# Patient Record
Sex: Male | Born: 1985 | Race: White | Hispanic: Yes | Marital: Single | Smoking: Never smoker
Health system: Southern US, Community
[De-identification: ages and names within clinical notes are randomized; demographics above are authoritative.]

## PROBLEM LIST (undated history)

## (undated) HISTORY — PX: HEMORRHOID SURGERY: SHX153

---
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
## 2004-03-07 ENCOUNTER — Emergency Department (HOSPITAL_COMMUNITY): Admission: EM | Admit: 2004-03-07 | Discharge: 2004-03-07 | Payer: Self-pay | Admitting: Emergency Medicine

## 2004-03-08 ENCOUNTER — Emergency Department (HOSPITAL_COMMUNITY): Admission: EM | Admit: 2004-03-08 | Discharge: 2004-03-08 | Payer: Self-pay | Admitting: Emergency Medicine

## 2004-10-25 ENCOUNTER — Emergency Department (HOSPITAL_COMMUNITY): Admission: EM | Admit: 2004-10-25 | Discharge: 2004-10-25 | Payer: Self-pay | Admitting: Emergency Medicine

## 2007-11-27 ENCOUNTER — Emergency Department (HOSPITAL_COMMUNITY): Admission: EM | Admit: 2007-11-27 | Discharge: 2007-11-27 | Payer: Self-pay | Admitting: Emergency Medicine

## 2011-03-23 LAB — URINALYSIS, ROUTINE W REFLEX MICROSCOPIC
Bilirubin Urine: NEGATIVE
Glucose, UA: NEGATIVE
Hgb urine dipstick: NEGATIVE
Ketones, ur: NEGATIVE
Leukocytes, UA: NEGATIVE
Nitrite: NEGATIVE
Protein, ur: 30 — AB
Specific Gravity, Urine: 1.018
Urobilinogen, UA: 1
pH: 5.5

## 2011-03-23 LAB — DIFFERENTIAL
Eosinophils Absolute: 0
Eosinophils Relative: 0
Lymphocytes Relative: 6 — ABNORMAL LOW
Lymphs Abs: 0.7
Monocytes Absolute: 1.1 — ABNORMAL HIGH
Monocytes Relative: 10

## 2011-03-23 LAB — CBC
HCT: 45.6
Hemoglobin: 15.9
MCV: 86.4
RBC: 5.28
WBC: 12 — ABNORMAL HIGH

## 2011-03-23 LAB — POCT I-STAT, CHEM 8
BUN: 8
Calcium, Ion: 1.1 — ABNORMAL LOW
Creatinine, Ser: 1
Glucose, Bld: 124 — ABNORMAL HIGH
Hemoglobin: 16
Sodium: 136
TCO2: 21

## 2011-03-23 LAB — URINE MICROSCOPIC-ADD ON

## 2013-07-14 ENCOUNTER — Encounter (HOSPITAL_COMMUNITY): Payer: Self-pay | Admitting: Emergency Medicine

## 2013-07-14 ENCOUNTER — Emergency Department (HOSPITAL_COMMUNITY)
Admission: EM | Admit: 2013-07-14 | Discharge: 2013-07-14 | Disposition: A | Discharge status: Home / Self Care | Payer: Medicaid - Out of State | Attending: Emergency Medicine | Admitting: Emergency Medicine

## 2013-07-14 DIAGNOSIS — L03317 Cellulitis of buttock: Secondary | ICD-10-CM

## 2013-07-14 DIAGNOSIS — R000 Tachycardia, unspecified: Secondary | ICD-10-CM | POA: Insufficient documentation

## 2013-07-14 DIAGNOSIS — L0231 Cutaneous abscess of buttock: Secondary | ICD-10-CM | POA: Insufficient documentation

## 2013-07-14 DIAGNOSIS — K921 Melena: Secondary | ICD-10-CM | POA: Insufficient documentation

## 2013-07-14 DIAGNOSIS — Z87828 Personal history of other (healed) physical injury and trauma: Secondary | ICD-10-CM | POA: Insufficient documentation

## 2013-07-14 DIAGNOSIS — K6289 Other specified diseases of anus and rectum: Secondary | ICD-10-CM | POA: Insufficient documentation

## 2013-07-14 DIAGNOSIS — R509 Fever, unspecified: Secondary | ICD-10-CM | POA: Insufficient documentation

## 2013-07-14 MED ORDER — AMOXICILLIN-POT CLAVULANATE 875-125 MG PO TABS: 1.0000 | ORAL_TABLET | Freq: Two times a day (BID) | ORAL | Status: DC

## 2013-07-14 MED ORDER — HYDROCODONE-ACETAMINOPHEN 5-325 MG PO TABS: 1.0000 | ORAL_TABLET | Freq: Four times a day (QID) | ORAL | Status: DC | PRN

## 2013-07-14 MED ORDER — HYDROCODONE-ACETAMINOPHEN 5-325 MG PO TABS
1.0000 | ORAL_TABLET | Freq: Once | ORAL | Status: AC
  Administered 2013-07-14: 1 via ORAL
  Filled 2013-07-14: qty 1

## 2013-07-14 NOTE — ED Notes (Signed)
Patient is alert and oriented x3.  He is complaining of hemmroid pain  That started on Thursday.  He states that he only has bleeding when he Has a BM.  He adds that his BMs are very hard.  Currently he rates his pain  8 of 10.

## 2013-07-14 NOTE — Discharge Instructions (Signed)
Cellulitis Cellulitis is an infection of the skin and the tissue beneath it. The infected area is usually red and tender. Cellulitis occurs most often in the arms and lower legs.  CAUSES  Cellulitis is caused by bacteria that enter the skin through cracks or cuts in the skin. The most common types of bacteria that cause cellulitis are Staphylococcus and Streptococcus. SYMPTOMS   Redness and warmth.  Swelling.  Tenderness or pain.  Fever. DIAGNOSIS  Your caregiver can usually determine what is wrong based on a physical exam. Blood tests may also be done. TREATMENT  Treatment usually involves taking an antibiotic medicine. HOME CARE INSTRUCTIONS   Take your antibiotics as directed. Finish them even if you start to feel better.  Keep the infected arm or leg elevated to reduce swelling.  Apply a warm cloth to the affected area up to 4 times per day to relieve pain.  Only take over-the-counter or prescription medicines for pain, discomfort, or fever as directed by your caregiver.  Keep all follow-up appointments as directed by your caregiver. SEEK MEDICAL CARE IF:   You notice red streaks coming from the infected area.  Your red area gets larger or turns dark in color.  Your bone or joint underneath the infected area becomes painful after the skin has healed.  Your infection returns in the same area or another area.  You notice a swollen bump in the infected area.  You develop new symptoms. SEEK IMMEDIATE MEDICAL CARE IF:   You have a fever.  You feel very sleepy.  You develop vomiting or diarrhea.  You have a general ill feeling (malaise) with muscle aches and pains. MAKE SURE YOU:   Understand these instructions.  Will watch your condition.  Will get help right away if you are not doing well or get worse. Document Released: 03/22/2005 Document Revised: 12/12/2011 Document Reviewed: 08/28/2011 Exit[employer] Patient Information 2014 [employer].  

## 2013-07-14 NOTE — Progress Notes (Signed)
   CARE MANAGEMENT ED NOTE 07/14/2013  Patient:  [name] [name],[name] [name]   Account Number:  [id number]  Date Initiated:  07/14/2013  Documentation initiated by:  Radford PaxFERRERO,Gerald Honea  Subjective/Objective Assessment:   Patient presents to Ed with hemmoroid pain     Subjective/Objective Assessment Detail:     Action/Plan:   Action/Plan Detail:   Anticipated DC Date:       Status Recommendation to Physician:   Result of Recommendation:    Other ED Services  Consult Working Plan    DC Planning Services  Other  PCP issues    Choice offered to / List presented to:            Status of service:  Completed, signed off  ED Comments:   ED Comments Detail:  EDCM spoke to patient at bedside.  Patient confirms he does not have a pcp and his Medicaid is not of [place].  EDCM provided patient with  a list of pcps who accept Medicaid insurance and also provided patient with the phone number to the DSS to have Medicaid insurance switched to [place]. Patient thankful for resources.  No furhter CM needs at this time.

## 2013-07-14 NOTE — ED Notes (Signed)
Pt states he has a hemorrhoid that developed on Thursday and got bigger on Friday  Pt states he has been using tumeric paste  Pt states he has bleeding with bowel movements  Last one was yesterday

## 2013-07-14 NOTE — ED Notes (Signed)
Patient is alert and oriented x3.  He was given DC instructions and follow up visit instructions.  Patient gave verbal understanding.  He was DC ambulatory under his own power to home.  V/S stable.  He was not showing any signs of distress on DC 
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
## 2013-07-17 ENCOUNTER — Inpatient Hospital Stay (HOSPITAL_COMMUNITY)
Admission: EM | Admit: 2013-07-17 | Discharge: 2013-07-22 | DRG: 349 | Disposition: A | Discharge status: Home / Self Care | Payer: Self-pay | Attending: General Surgery | Admitting: General Surgery

## 2013-07-17 ENCOUNTER — Encounter (HOSPITAL_COMMUNITY): Payer: Self-pay | Admitting: Emergency Medicine

## 2013-07-17 DIAGNOSIS — L03317 Cellulitis of buttock: Secondary | ICD-10-CM

## 2013-07-17 DIAGNOSIS — K612 Anorectal abscess: Principal | ICD-10-CM | POA: Diagnosis present

## 2013-07-17 DIAGNOSIS — K610 Anal abscess: Secondary | ICD-10-CM

## 2013-07-17 LAB — CBC WITH DIFFERENTIAL/PLATELET
BASOS ABS: 0 10*3/uL (ref 0.0–0.1)
Basophils Relative: 0 % (ref 0–1)
Eosinophils Absolute: 0.1 10*3/uL (ref 0.0–0.7)
Eosinophils Relative: 1 % (ref 0–5)
HEMATOCRIT: 41.3 % (ref 39.0–52.0)
HEMOGLOBIN: 15.1 g/dL (ref 13.0–17.0)
LYMPHS PCT: 18 % (ref 12–46)
Lymphs Abs: 2.1 10*3/uL (ref 0.7–4.0)
MCH: 30.3 pg (ref 26.0–34.0)
MCHC: 36.6 g/dL — ABNORMAL HIGH (ref 30.0–36.0)
MCV: 82.8 fL (ref 78.0–100.0)
MONO ABS: 1.1 10*3/uL — AB (ref 0.1–1.0)
MONOS PCT: 10 % (ref 3–12)
NEUTROS ABS: 8.3 10*3/uL — AB (ref 1.7–7.7)
NEUTROS PCT: 72 % (ref 43–77)
Platelets: 346 10*3/uL (ref 150–400)
RBC: 4.99 MIL/uL (ref 4.22–5.81)
RDW: 11.5 % (ref 11.5–15.5)
WBC: 11.6 10*3/uL — AB (ref 4.0–10.5)

## 2013-07-17 LAB — BASIC METABOLIC PANEL
BUN: 8 mg/dL (ref 6–23)
CHLORIDE: 96 meq/L (ref 96–112)
CO2: 23 meq/L (ref 19–32)
CREATININE: 0.83 mg/dL (ref 0.50–1.35)
Calcium: 9 mg/dL (ref 8.4–10.5)
GFR calc Af Amer: >90 mL/min (ref >90)
GFR calc non Af Amer: >90 mL/min (ref >90)
Glucose, Bld: 101 mg/dL — ABNORMAL HIGH (ref 70–99)
POTASSIUM: 4.2 meq/L (ref 3.7–5.3)
Sodium: 134 mEq/L — ABNORMAL LOW (ref 137–147)

## 2013-07-17 MED ORDER — SODIUM CHLORIDE 0.9 % IV BOLUS (SEPSIS)
1000.0000 mL | Freq: Once | INTRAVENOUS | Status: AC
  Administered 2013-07-17: 1000 mL via INTRAVENOUS

## 2013-07-17 MED ORDER — ONDANSETRON HCL 4 MG/2ML IJ SOLN
4.0000 mg | Freq: Once | INTRAMUSCULAR | Status: AC
  Administered 2013-07-17: 4 mg via INTRAVENOUS
  Filled 2013-07-17: qty 2

## 2013-07-17 MED ORDER — OXYCODONE-ACETAMINOPHEN 5-325 MG PO TABS
1.0000 | ORAL_TABLET | Freq: Once | ORAL | Status: AC
  Administered 2013-07-17: 1 via ORAL
  Filled 2013-07-17: qty 1

## 2013-07-17 MED ORDER — MORPHINE SULFATE 4 MG/ML IJ SOLN
4.0000 mg | Freq: Once | INTRAMUSCULAR | Status: AC
  Administered 2013-07-17: 4 mg via INTRAVENOUS
  Filled 2013-07-17: qty 1

## 2013-07-17 MED ORDER — CLINDAMYCIN PHOSPHATE 600 MG/50ML IV SOLN
600.0000 mg | Freq: Once | INTRAVENOUS | Status: AC
  Administered 2013-07-17: 600 mg via INTRAVENOUS
  Filled 2013-07-17: qty 50

## 2013-07-17 NOTE — ED Notes (Signed)
Pt states he was seen at [facility] on Tuesday and DX with cellulitis on his right buttox.  Pt states he has been taking the medication he was prescribed without relief from pain, and the area is getting larger.
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
## 2013-07-18 ENCOUNTER — Emergency Department (HOSPITAL_COMMUNITY): Payer: Self-pay

## 2013-07-18 ENCOUNTER — Encounter (HOSPITAL_COMMUNITY): Payer: Self-pay | Admitting: Radiology

## 2013-07-18 ENCOUNTER — Encounter (HOSPITAL_COMMUNITY): Admission: EM | Disposition: A | Discharge status: Home / Self Care | Payer: Self-pay | Source: Home / Self Care

## 2013-07-18 ENCOUNTER — Observation Stay (HOSPITAL_COMMUNITY): Payer: Self-pay | Admitting: Anesthesiology

## 2013-07-18 ENCOUNTER — Encounter (HOSPITAL_COMMUNITY): Payer: Self-pay | Admitting: Anesthesiology

## 2013-07-18 DIAGNOSIS — K610 Anal abscess: Secondary | ICD-10-CM | POA: Diagnosis present

## 2013-07-18 DIAGNOSIS — K612 Anorectal abscess: Secondary | ICD-10-CM

## 2013-07-18 HISTORY — PX: INCISION AND DRAINAGE PERIRECTAL ABSCESS: SHX1804

## 2013-07-18 LAB — MRSA PCR SCREENING: MRSA BY PCR: NEGATIVE

## 2013-07-18 SURGERY — INCISION AND DRAINAGE, ABSCESS, PERIRECTAL
Anesthesia: General | Site: Rectum | Laterality: Right

## 2013-07-18 MED ORDER — MORPHINE SULFATE 4 MG/ML IJ SOLN
4.0000 mg | INTRAMUSCULAR | Status: DC | PRN
  Administered 2013-07-18: 4 mg via INTRAVENOUS
  Filled 2013-07-18: qty 1

## 2013-07-18 MED ORDER — IOHEXOL 300 MG/ML  SOLN
80.0000 mL | Freq: Once | INTRAMUSCULAR | Status: AC | PRN
  Administered 2013-07-18: 80 mL via INTRAVENOUS

## 2013-07-18 MED ORDER — LACTATED RINGERS IV SOLN
INTRAVENOUS | Status: DC
  Administered 2013-07-18: 12:00:00 via INTRAVENOUS
  Administered 2013-07-18: 20 mL/h via INTRAVENOUS

## 2013-07-18 MED ORDER — ROCURONIUM BROMIDE 50 MG/5ML IV SOLN
INTRAVENOUS | Status: AC
  Filled 2013-07-18: qty 1

## 2013-07-18 MED ORDER — LIDOCAINE HCL (CARDIAC) 10 MG/ML IV SOLN
INTRAVENOUS | Status: DC | PRN
  Administered 2013-07-18: 100 mg via INTRAVENOUS

## 2013-07-18 MED ORDER — FENTANYL CITRATE 0.05 MG/ML IJ SOLN
INTRAMUSCULAR | Status: AC
  Filled 2013-07-18: qty 5

## 2013-07-18 MED ORDER — MIDAZOLAM HCL 5 MG/5ML IJ SOLN
INTRAMUSCULAR | Status: DC | PRN
  Administered 2013-07-18: 2 mg via INTRAVENOUS

## 2013-07-18 MED ORDER — SODIUM CHLORIDE 0.9 % IV SOLN
INTRAVENOUS | Status: AC
  Administered 2013-07-18: 03:00:00 via INTRAVENOUS

## 2013-07-18 MED ORDER — BUPIVACAINE-EPINEPHRINE PF 0.5-1:200000 % IJ SOLN
INTRAMUSCULAR | Status: DC | PRN
  Administered 2013-07-18: 30 mL

## 2013-07-18 MED ORDER — ONDANSETRON HCL 4 MG/2ML IJ SOLN
INTRAMUSCULAR | Status: DC | PRN
  Administered 2013-07-18: 4 mg via INTRAVENOUS

## 2013-07-18 MED ORDER — LIDOCAINE HCL (CARDIAC) 20 MG/ML IV SOLN
INTRAVENOUS | Status: AC
  Filled 2013-07-18: qty 5

## 2013-07-18 MED ORDER — OXYCODONE-ACETAMINOPHEN 5-325 MG PO TABS
1.0000 | ORAL_TABLET | ORAL | Status: DC | PRN
  Administered 2013-07-18 – 2013-07-22 (×12): 2 via ORAL
  Filled 2013-07-18 (×12): qty 2

## 2013-07-18 MED ORDER — MIDAZOLAM HCL 2 MG/2ML IJ SOLN
INTRAMUSCULAR | Status: AC
  Filled 2013-07-18: qty 2

## 2013-07-18 MED ORDER — CLINDAMYCIN PHOSPHATE 600 MG/50ML IV SOLN
600.0000 mg | Freq: Four times a day (QID) | INTRAVENOUS | Status: DC
  Administered 2013-07-18 – 2013-07-22 (×18): 600 mg via INTRAVENOUS
  Filled 2013-07-18 (×21): qty 50

## 2013-07-18 MED ORDER — OXYCODONE HCL 5 MG/5ML PO SOLN: 5.0000 mg | Freq: Once | ORAL | Status: DC | PRN

## 2013-07-18 MED ORDER — ONDANSETRON HCL 4 MG/2ML IJ SOLN: 4.0000 mg | Freq: Once | INTRAMUSCULAR | Status: DC | PRN

## 2013-07-18 MED ORDER — ONDANSETRON HCL 4 MG/2ML IJ SOLN
INTRAMUSCULAR | Status: AC
  Filled 2013-07-18: qty 2

## 2013-07-18 MED ORDER — LACTATED RINGERS IV SOLN
INTRAVENOUS | Status: DC | PRN
  Administered 2013-07-18: 12:00:00 via INTRAVENOUS

## 2013-07-18 MED ORDER — HYDROMORPHONE HCL PF 1 MG/ML IJ SOLN: 0.2500 mg | INTRAMUSCULAR | Status: DC | PRN

## 2013-07-18 MED ORDER — FENTANYL CITRATE 0.05 MG/ML IJ SOLN
INTRAMUSCULAR | Status: DC | PRN
  Administered 2013-07-18 (×3): 50 ug via INTRAVENOUS

## 2013-07-18 MED ORDER — ONDANSETRON HCL 4 MG/2ML IJ SOLN: 4.0000 mg | Freq: Three times a day (TID) | INTRAMUSCULAR | Status: AC | PRN

## 2013-07-18 MED ORDER — PROPOFOL 10 MG/ML IV BOLUS
INTRAVENOUS | Status: DC | PRN
  Administered 2013-07-18: 200 mg via INTRAVENOUS

## 2013-07-18 MED ORDER — 0.9 % SODIUM CHLORIDE (POUR BTL) OPTIME
TOPICAL | Status: DC | PRN
  Administered 2013-07-18: 1000 mL

## 2013-07-18 MED ORDER — OXYCODONE HCL 5 MG PO TABS: 5.0000 mg | ORAL_TABLET | Freq: Once | ORAL | Status: DC | PRN

## 2013-07-18 MED ORDER — MEPERIDINE HCL 25 MG/ML IJ SOLN: 6.2500 mg | INTRAMUSCULAR | Status: DC | PRN

## 2013-07-18 MED ORDER — PROPOFOL 10 MG/ML IV BOLUS
INTRAVENOUS | Status: AC
  Filled 2013-07-18: qty 20

## 2013-07-18 MED ORDER — PIPERACILLIN-TAZOBACTAM 3.375 G IVPB
3.3750 g | Freq: Three times a day (TID) | INTRAVENOUS | Status: DC
  Administered 2013-07-18 – 2013-07-22 (×13): 3.375 g via INTRAVENOUS
  Filled 2013-07-18 (×16): qty 50

## 2013-07-18 MED ORDER — MORPHINE SULFATE 4 MG/ML IJ SOLN: 4.0000 mg | INTRAMUSCULAR | Status: DC | PRN

## 2013-07-18 MED ORDER — HYDROMORPHONE HCL PF 1 MG/ML IJ SOLN
1.0000 mg | INTRAMUSCULAR | Status: DC | PRN
  Administered 2013-07-18 (×5): 1 mg via INTRAVENOUS
  Administered 2013-07-19: 2 mg via INTRAVENOUS
  Administered 2013-07-19: 1 mg via INTRAVENOUS
  Administered 2013-07-19: 2 mg via INTRAVENOUS
  Administered 2013-07-20: 1 mg via INTRAVENOUS
  Administered 2013-07-20: 2 mg via INTRAVENOUS
  Administered 2013-07-21 – 2013-07-22 (×4): 1 mg via INTRAVENOUS
  Filled 2013-07-18: qty 2
  Filled 2013-07-18 (×3): qty 1
  Filled 2013-07-18: qty 2
  Filled 2013-07-18 (×2): qty 1
  Filled 2013-07-18 (×2): qty 2
  Filled 2013-07-18 (×5): qty 1

## 2013-07-18 SURGICAL SUPPLY — 40 items
BLADE SURG 15 STRL LF DISP TIS (BLADE) ×1 IMPLANT
BLADE SURG 15 STRL SS (BLADE) ×3
CANISTER SUCTION 2500CC (MISCELLANEOUS) ×3 IMPLANT
COVER SURGICAL LIGHT HANDLE (MISCELLANEOUS) ×3 IMPLANT
DRSG KUZMA FLUFF (GAUZE/BANDAGES/DRESSINGS) ×2 IMPLANT
DRSG PAD ABDOMINAL 8X10 ST (GAUZE/BANDAGES/DRESSINGS) ×3 IMPLANT
ELECT CAUTERY BLADE 6.4 (BLADE) ×3 IMPLANT
ELECT REM PT RETURN 9FT ADLT (ELECTROSURGICAL) ×3
ELECTRODE REM PT RTRN 9FT ADLT (ELECTROSURGICAL) ×1 IMPLANT
GAUZE PACKING IODOFORM 1 (PACKING) IMPLANT
GLOVE BIO SURGEON STRL SZ7.5 (GLOVE) ×2 IMPLANT
GLOVE BIO SURGEON STRL SZ8 (GLOVE) ×4 IMPLANT
GLOVE BIOGEL PI IND STRL 7.5 (GLOVE) IMPLANT
GLOVE BIOGEL PI IND STRL 8 (GLOVE) IMPLANT
GLOVE BIOGEL PI INDICATOR 7.5 (GLOVE) ×2
GLOVE BIOGEL PI INDICATOR 8 (GLOVE) ×2
GLOVE SURG SIGNA 7.5 PF LTX (GLOVE) ×3 IMPLANT
GOWN STRL NON-REIN LRG LVL3 (GOWN DISPOSABLE) ×3 IMPLANT
GOWN STRL REIN XL XLG (GOWN DISPOSABLE) ×5 IMPLANT
KIT BASIN OR (CUSTOM PROCEDURE TRAY) ×3 IMPLANT
KIT ROOM TURNOVER OR (KITS) ×3 IMPLANT
NEEDLE 22X1 1/2 (OR ONLY) (NEEDLE) ×2 IMPLANT
NS IRRIG 1000ML POUR BTL (IV SOLUTION) ×3 IMPLANT
PACK LITHOTOMY IV (CUSTOM PROCEDURE TRAY) ×3 IMPLANT
PAD ARMBOARD 7.5X6 YLW CONV (MISCELLANEOUS) ×3 IMPLANT
PENCIL BUTTON HOLSTER BLD 10FT (ELECTRODE) ×3 IMPLANT
SPONGE GAUZE 4X4 12PLY (GAUZE/BANDAGES/DRESSINGS) ×3 IMPLANT
SPONGE LAP 18X18 X RAY DECT (DISPOSABLE) ×3 IMPLANT
SWAB COLLECTION DEVICE MRSA (MISCELLANEOUS) IMPLANT
SYR BULB 3OZ (MISCELLANEOUS) ×3 IMPLANT
SYR CONTROL 10ML LL (SYRINGE) ×2 IMPLANT
TAPE CLOTH SURG 6X10 WHT LF (GAUZE/BANDAGES/DRESSINGS) ×2 IMPLANT
TOWEL OR 17X24 6PK STRL BLUE (TOWEL DISPOSABLE) ×3 IMPLANT
TOWEL OR 17X26 10 PK STRL BLUE (TOWEL DISPOSABLE) ×3 IMPLANT
TOWEL OR NON WOVEN STRL DISP B (DISPOSABLE) ×2 IMPLANT
TUBE ANAEROBIC SPECIMEN COL (MISCELLANEOUS) IMPLANT
TUBE CONNECTING 12'X1/4 (SUCTIONS) ×1
TUBE CONNECTING 12X1/4 (SUCTIONS) ×2 IMPLANT
UNDERPAD 30X30 INCONTINENT (UNDERPADS AND DIAPERS) ×3 IMPLANT
YANKAUER SUCT BULB TIP NO VENT (SUCTIONS) ×3 IMPLANT

## 2013-07-18 NOTE — Transfer of Care (Signed)
Immediate Anesthesia Transfer of Care Note  Patient: [name] [name]  Procedure(s) Performed: Procedure(s): IRRIGATION AND DEBRIDEMENT PERIRECTAL ABSCESS (Right)  Patient Location: PACU  Anesthesia Type:General  Level of Consciousness: sedated  Airway & Oxygen Therapy: Patient Spontanous Breathing and Patient connected to nasal cannula oxygen  Post-op Assessment: Report given to PACU RN and Post -op Vital signs reviewed and stable  Post vital signs: Reviewed  Complications: No apparent anesthesia complications

## 2013-07-18 NOTE — Interval H&P Note (Signed)
History and Physical Interval Note: no change in H adn P.  Plan I and D of perirectal abscess on right.  I again discussed the risks with him  07/18/2013 10:44 AM  Wiliam Kelemente Codd  has presented today for surgery, with the diagnosis of Perirectal abscess  The various methods of treatment have been discussed with the patient and family. After consideration of risks, benefits and other options for treatment, the patient has consented to  Procedure(s): IRRIGATION AND DEBRIDEMENT PERIRECTAL ABSCESS (Right) as a surgical intervention .  The patient's history has been reviewed, patient examined, no change in status, stable for surgery.  I have reviewed the patient's chart and labs.  Questions were answered to the patient's satisfaction.     Milka Windholz A

## 2013-07-18 NOTE — Anesthesia Preprocedure Evaluation (Signed)
Anesthesia Evaluation  Patient identified by MRN, date of birth, ID band Patient awake    Reviewed: Allergy & Precautions, H&P , NPO status , Patient's Chart, lab work & pertinent test results  Airway       Dental   Pulmonary          Cardiovascular     Neuro/Psych    GI/Hepatic   Endo/Other    Renal/GU      Musculoskeletal   Abdominal   Peds  Hematology   Anesthesia Other Findings   Reproductive/Obstetrics                           Anesthesia Physical Anesthesia Plan  ASA: II  Anesthesia Plan: General   Post-op Pain Management:    Induction:   Airway Management Planned:   Additional Equipment:   Intra-op Plan:   Post-operative Plan:   Informed Consent: I have reviewed the patients History and Physical, chart, labs and discussed the procedure including the risks, benefits and alternatives for the proposed anesthesia with the patient or authorized representative who has indicated his/her understanding and acceptance.     Plan Discussed with: CRNA and Surgeon  Anesthesia Plan Comments:         Anesthesia Quick Evaluation

## 2013-07-18 NOTE — ED Provider Notes (Signed)
Care assumed from Dr Bernette MayersSheldon at change of shift.  Pt with redness, swelling ongoing for the last 4-5 days.  [facility], started on augmentin.  Pt with worsening redness, concern for abscess.  CT pending.  Pt reports pain is severe now, unable to walk or lay on back.  No fevers.  Has h/o buttock abscess in past requiring drainage, but not this severe.  No other significant pmh.  Results for orders placed during the hospital encounter of 07/17/13  CBC WITH DIFFERENTIAL      Result Value Range   WBC 11.6 (*) 4.0 - 10.5 K/uL   RBC 4.99  4.22 - 5.81 MIL/uL   Hemoglobin 15.1  13.0 - 17.0 g/dL   HCT 16.141.3  09.639.0 - 04.552.0 %   MCV 82.8  78.0 - 100.0 fL   MCH 30.3  26.0 - 34.0 pg   MCHC 36.6 (*) 30.0 - 36.0 g/dL   RDW 40.911.5  81.111.5 - 91.415.5 %   Platelets 346  150 - 400 K/uL   Neutrophils Relative % 72  43 - 77 %   Neutro Abs 8.3 (*) 1.7 - 7.7 K/uL   Lymphocytes Relative 18  12 - 46 %   Lymphs Abs 2.1  0.7 - 4.0 K/uL   Monocytes Relative 10  3 - 12 %   Monocytes Absolute 1.1 (*) 0.1 - 1.0 K/uL   Eosinophils Relative 1  0 - 5 %   Eosinophils Absolute 0.1  0.0 - 0.7 K/uL   Basophils Relative 0  0 - 1 %   Basophils Absolute 0.0  0.0 - 0.1 K/uL  BASIC METABOLIC PANEL      Result Value Range   Sodium 134 (*) 137 - 147 mEq/L   Potassium 4.2  3.7 - 5.3 mEq/L   Chloride 96  96 - 112 mEq/L   CO2 23  19 - 32 mEq/L   Glucose, Bld 101 (*) 70 - 99 mg/dL   BUN 8  6 - 23 mg/dL   Creatinine, Ser 7.820.83  0.50 - 1.35 mg/dL   Calcium 9.0  8.4 - 95.610.5 mg/dL   GFR calc non Af Amer >90  >90 mL/min   GFR calc Af Amer >90  >90 mL/min   Ct Pelvis W Contrast  07/18/2013   CLINICAL DATA:  Diagnosed with cellulitis of right buttock. Evaluate perirectal abscess. Worsening pain.  EXAM: CT PELVIS WITH CONTRAST  TECHNIQUE: Multidetector CT imaging of the pelvis was performed using the standard protocol following the bolus administration of intravenous contrast.  CONTRAST:  80mL OMNIPAQUE IOHEXOL 300 MG/ML  SOLN   COMPARISON:  None.  FINDINGS: Cellulitis about the right sided gluteal cleft. A fluid collection within the anterior portion of the cleft, just inferior to the anal introitus. This measures 5.7 x 2.3 cm on transverse image 139/ series 4 and 5.0 cm craniocaudal on image 61 coronal. There is likely minimal extension into the left gluteal cleft. No direct communication with the anus identified. The anus and rectum are normal in appearance.  Pelvic small bowel loops are within normal limits. Mild right external iliac adenopathy at 1.5 cm on image 28/series 3. Borderline left external iliac adenopathy at 1.0 cm.  Normal urinary bladder and prostate. No significant free fluid.  No acute osseous abnormality.  IMPRESSION: 1. Perianal abscess centered in the right gluteal cleft, just inferior to the anal introitus. No definite communication identified. Please note that CT is of low sensitivity for fistula, and  if this is a clinical concern, the test of choice is high-resolution dedicated contrast-enhanced pelvic MRI. 2. Right external iliac adenopathy, favored to be reactive. If the patient undergoes follow-up imaging, recommend attention to this area.   Electronically Signed   By: Jeronimo Greaves M.D.   On: 07/18/2013 00:55     1:30 AM Pt re-examined.  Large area of fluctuance and erythema to right buttock.  Unable to do thorough exam, however, due to patient's pain.  Do not feel this abscess is amenable to bedside drainage.  Will d/w surgery.    Olivia Mackie, MD 07/18/13 [phone]

## 2013-07-18 NOTE — Anesthesia Postprocedure Evaluation (Signed)
Anesthesia Post Note  Patient: [name] [name]  Procedure(s) Performed: Procedure(s) (LRB): IRRIGATION AND DEBRIDEMENT PERIRECTAL ABSCESS (Right)  Anesthesia type: general  Patient location: PACU  Post pain: Pain level controlled  Post assessment: Patient's Cardiovascular Status Stable  Last Vitals:  Filed Vitals:   07/18/13 1445  BP: 126/75  Pulse: 93  Temp: 37 C  Resp: 16    Post vital signs: Reviewed and stable  Level of consciousness: sedated  Complications: No apparent anesthesia complications
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
## 2013-07-18 NOTE — Op Note (Signed)
IRRIGATION AND DEBRIDEMENT PERIRECTAL ABSCESS  Procedure Note  [name] [name] 07/17/2013 - 07/18/2013   Pre-op Diagnosis: Perirectal abscess     Post-op Diagnosis: same  Procedure(s): IRRIGATION AND DRAINAGE PERIRECTAL ABSCESS  Surgeon(s): Shelly Rubensteinouglas [name] Ceejay Kegley, MD  Anesthesia: General  Staff:  Circulator: Simeon CraftJamie Elizabeth Sipsis, RN Relief Circulator: Netta CorriganAnita R Selby, RN Relief Scrub: Jolinda Croakrystal B Bigelow, RN Scrub Person: Denese KillingsMichael Vincent Pingue, CST Circulator Assistant: Jani FilesKasi C Shay, RN; Alpha GulaKathy Somers, RN  Estimated Blood Loss: Minimal                         [name] [name]   Date: 07/18/2013  Time: 1:05 PM

## 2013-07-18 NOTE — Anesthesia Procedure Notes (Signed)
Procedure Name: LMA Insertion Date/Time: 07/18/2013 12:49 PM Performed by: Romie MinusOCK, Lavanya Roa K Pre-anesthesia Checklist: Patient identified, Emergency Drugs available, Suction available, Patient being monitored and Timeout performed Patient Re-evaluated:Patient Re-evaluated prior to inductionOxygen Delivery Method: Circle system utilized Preoxygenation: Pre-oxygenation with 100% oxygen Intubation Type: IV induction LMA: LMA inserted LMA Size: 5.0 Number of attempts: 1 Placement Confirmation: positive ETCO2,  CO2 detector and breath sounds checked- equal and bilateral Tube secured with: Tape Dental Injury: Teeth and Oropharynx as per pre-operative assessment

## 2013-07-18 NOTE — Progress Notes (Signed)
UR completed. Patient changed to inpatient- requiring IV antibiotics and IVF 

## 2013-07-18 NOTE — Preoperative (Signed)
Beta Blockers   Reason not to administer Beta Blockers:Not Applicable 

## 2013-07-19 MED ORDER — INFLUENZA VAC SPLIT QUAD 0.5 ML IM SUSP
0.5000 mL | INTRAMUSCULAR | Status: DC
  Filled 2013-07-19: qty 0.5

## 2013-07-19 MED ORDER — ENOXAPARIN SODIUM 60 MG/0.6ML ~~LOC~~ SOLN
50.0000 mg | SUBCUTANEOUS | Status: DC
  Administered 2013-07-19 – 2013-07-22 (×4): 50 mg via SUBCUTANEOUS
  Filled 2013-07-19 (×4): qty 0.6

## 2013-07-19 NOTE — Progress Notes (Signed)
1 Day Post-Op  Subjective: Quite sore  Objective: Vital signs in last 24 hours: Temp:  [97.6 F (36.4 C)-99.8 F (37.7 C)] 97.6 F (36.4 C) (01/24 0500) Pulse Rate:  [89-103] 89 (01/24 0500) Resp:  [14-20] 18 (01/24 0500) BP: (107-140)/(59-85) 114/70 mmHg (01/24 0500) SpO2:  [92 %-100 %] 97 % (01/24 0500) Last BM Date: 07/16/13  Intake/Output from previous day: 01/23 0701 - 01/24 0700 In: 1443 [P.O.:480; I.V.:813; IV Piggyback:150] Out: 2201 [Urine:2201] Intake/Output this shift:    General appearance: alert and cooperative Resp: clear to auscultation bilaterally Cardio: regular rate and rhythm GI: soft, NT, R perirectal abscess with serosang drainage, mild cellulitis Neurologic: Mental status: Alert, oriented, thought content appropriate  Lab Results:   Recent Labs  07/17/13 1915  WBC 11.6*  HGB 15.1  HCT 41.3  PLT 346   BMET  Recent Labs  07/17/13 1915  NA 134*  K 4.2  CL 96  CO2 23  GLUCOSE 101*  BUN 8  CREATININE 0.83  CALCIUM 9.0   PT/INR No results found for this basename: LABPROT, INR,  in the last 72 hours ABG No results found for this basename: PHART, PCO2, PO2, HCO3,  in the last 72 hours  Studies/Results: Ct Pelvis W Contrast  07/18/2013   CLINICAL DATA:  Diagnosed with cellulitis of right buttock. Evaluate perirectal abscess. Worsening pain.  EXAM: CT PELVIS WITH CONTRAST  TECHNIQUE: Multidetector CT imaging of the pelvis was performed using the standard protocol following the bolus administration of intravenous contrast.  CONTRAST:  80mL OMNIPAQUE IOHEXOL 300 MG/ML  SOLN  COMPARISON:  None.  FINDINGS: Cellulitis about the right sided gluteal cleft. A fluid collection within the anterior portion of the cleft, just inferior to the anal introitus. This measures 5.7 x 2.3 cm on transverse image 139/ series 4 and 5.0 cm craniocaudal on image 61 coronal. There is likely minimal extension into the left gluteal cleft. No direct communication with the  anus identified. The anus and rectum are normal in appearance.  Pelvic small bowel loops are within normal limits. Mild right external iliac adenopathy at 1.5 cm on image 28/series 3. Borderline left external iliac adenopathy at 1.0 cm.  Normal urinary bladder and prostate. No significant free fluid.  No acute osseous abnormality.  IMPRESSION: 1. Perianal abscess centered in the right gluteal cleft, just inferior to the anal introitus. No definite communication identified. Please note that CT is of low sensitivity for fistula, and if this is a clinical concern, the test of choice is high-resolution dedicated contrast-enhanced pelvic MRI. 2. Right external iliac adenopathy, favored to be reactive. If the patient undergoes follow-up imaging, recommend attention to this area.   Electronically Signed   By: Jeronimo GreavesKyle  Talbot M.D.   On: 07/18/2013 00:55    Anti-infectives: Anti-infectives   Start     Dose/Rate Route Frequency Ordered Stop   07/18/13 0600  clindamycin (CLEOCIN) IVPB 600 mg     600 mg 100 mL/hr over 30 Minutes Intravenous 4 times per day 07/18/13 0248     07/18/13 0600  piperacillin-tazobactam (ZOSYN) IVPB 3.375 g     3.375 g 12.5 mL/hr over 240 Minutes Intravenous 3 times per day 07/18/13 0544     07/17/13 2245  clindamycin (CLEOCIN) IVPB 600 mg     600 mg 100 mL/hr over 30 Minutes Intravenous  Once 07/17/13 2244 07/18/13 0014      Assessment/Plan: R perirectal abscess - S/P I&D, wet to dry BID, continue Zosyn, CBC AM VTE -  Lovenox Dispo - still needs IV ABX   LOS: 2 days    Clancy Leiner E 07/19/2013

## 2013-07-19 NOTE — Progress Notes (Signed)
No orders present for VTE prophylaxis. Paged [facility]. MD on on call (Dr. Derrell Lollingamirez) made aware. No orders placed at this time. Pt is ambulatory. Will continue to monitor.

## 2013-07-19 NOTE — Op Note (Signed)
NAMWiliam Ke:  Swindler, Brandis            ACCOUNT NO.:  [id number]  MEDICAL RECORD NO.:  [id number]  LOCATION:  [id number]                        FACILITY:  [facility]  PHYSICIAN:  Abigail Miyamotoouglas Louanne Calvillo, M.D. DATE OF BIRTH:  Jul 21, 1985  DATE OF PROCEDURE:  07/18/2013 DATE OF DISCHARGE:                              OPERATIVE REPORT   PREOPERATIVE DIAGNOSIS:  Perirectal abscess.  POSTOPERATIVE DIAGNOSIS:  Perirectal abscess.  PROCEDURE:  Incision and drainage of perirectal abscess.  SURGEON:  Abigail Miyamotoouglas Ceejay Kegley, M.D.  ANESTHESIA:  General and 0.5% Marcaine with epinephrine.  ESTIMATED BLOOD LOSS:  Minimal.  FINDINGS:  The patient was found to have very large right-sided perirectal abscess.  PROCEDURE IN DETAIL:  The patient was brought to the operating room, identified as [name] [name].  He was placed supine on the operating table, and general anesthesia was induced.  The patient was then placed in lithotomy position.  His perianal area was then prepped and draped in usual sterile fashion.  The patient had a large fluctuant abscess in the right perianal area toward the buttock.  I made an incision with a scalpel and entered a very large abscess cavity.  All the purulence was drained from the cavity.  I then anesthetized it circumferentially with Marcaine.  I achieved hemostasis with the cautery.  I then packed the wound with a wet-to-dry 2-inch Kerlix gauze.  Dry gauze and ABDs were then applied.  The patient tolerated the procedure well.  All the counts were correct at the end of procedure.  The patient was then extubated in the operating room and taken in a stable condition to the recovery room.     Abigail Miyamotoouglas Gladyes Kudo, M.D.     DB/MEDQ  D:  07/18/2013  T:  07/19/2013  Job:  [id number]

## 2013-07-20 LAB — CBC
HCT: 35.8 % — ABNORMAL LOW (ref 39.0–52.0)
HEMOGLOBIN: 12.9 g/dL — AB (ref 13.0–17.0)
MCH: 29.7 pg (ref 26.0–34.0)
MCHC: 36 g/dL (ref 30.0–36.0)
MCV: 82.3 fL (ref 78.0–100.0)
Platelets: 314 10*3/uL (ref 150–400)
RBC: 4.35 MIL/uL (ref 4.22–5.81)
RDW: 11.4 % — AB (ref 11.5–15.5)
WBC: 5.2 10*3/uL (ref 4.0–10.5)

## 2013-07-20 NOTE — Progress Notes (Signed)
2 Days Post-Op  Subjective: Pt doing well.  Changing dressing   Objective: Vital signs in last 24 hours: Temp:  [97.6 F (36.4 C)-98.3 F (36.8 C)] 98 F (36.7 C) (01/25 0500) Pulse Rate:  [65-83] 65 (01/25 0500) Resp:  [16-20] 16 (01/25 0500) BP: (98-115)/(59-66) 115/60 mmHg (01/25 0500) SpO2:  [97 %-98 %] 97 % (01/25 0500) Weight:  [230 lb (104.327 kg)] 230 lb (104.327 kg) (01/25 0357) Last BM Date: 07/16/13  Intake/Output from previous day: 01/24 0701 - 01/25 0700 In: 1818.7 [P.O.:1080; I.V.:588.7; IV Piggyback:150] Out: -  Intake/Output this shift:    General appearance: alert and cooperative Incision/Wound: c/d/i, some induration  Lab Results:   Recent Labs  07/17/13 1915 07/20/13 0500  WBC 11.6* 5.2  HGB 15.1 12.9*  HCT 41.3 35.8*  PLT 346 314   BMET  Recent Labs  07/17/13 1915  NA 134*  K 4.2  CL 96  CO2 23  GLUCOSE 101*  BUN 8  CREATININE 0.83  CALCIUM 9.0    Studies/Results: No results found.  Anti-infectives: Anti-infectives   Start     Dose/Rate Route Frequency Ordered Stop   07/18/13 0600  clindamycin (CLEOCIN) IVPB 600 mg     600 mg 100 mL/hr over 30 Minutes Intravenous 4 times per day 07/18/13 0248     07/18/13 0600  piperacillin-tazobactam (ZOSYN) IVPB 3.375 g     3.375 g 12.5 mL/hr over 240 Minutes Intravenous 3 times per day 07/18/13 0544     07/17/13 2245  clindamycin (CLEOCIN) IVPB 600 mg     600 mg 100 mL/hr over 30 Minutes Intravenous  Once 07/17/13 2244 07/18/13 0014      Assessment/Plan: s/p Procedure(s): IRRIGATION AND DEBRIDEMENT PERIRECTAL ABSCESS (Right) Continue ABX therapy due to Post-op infection Con't BID dressing changes Home in 1-2d likely if wound con't looking good.  LOS: 3 days    Marigene EhlersRamirez Jr., [facility] 07/20/2013

## 2013-07-21 MED ORDER — SODIUM CHLORIDE 0.9 % IJ SOLN: 3.0000 mL | Freq: Two times a day (BID) | INTRAMUSCULAR | Status: DC

## 2013-07-21 MED ORDER — POLYETHYLENE GLYCOL 3350 17 G PO PACK
17.0000 g | PACK | Freq: Every day | ORAL | Status: DC
  Filled 2013-07-21 (×2): qty 1

## 2013-07-21 MED ORDER — SODIUM CHLORIDE 0.9 % IJ SOLN: 3.0000 mL | INTRAMUSCULAR | Status: DC | PRN

## 2013-07-21 NOTE — Progress Notes (Signed)
Patient ID: [name] [name], male   DOB: December 17, 1985, 28 y.o.   MRN: [id number]  Subjective: Afebrile.  VSS.  tolerating diet.  Had a BM this AM.   Objective:  Vital signs:  Filed Vitals:   07/20/13 0500 07/20/13 1305 07/20/13 2140 07/21/13 0537  BP: 115/60 117/70 119/68 134/80  Pulse: 65 77 71 70  Temp: 98 F (36.7 C) 97.8 F (36.6 C) 97.5 F (36.4 C) 97.6 F (36.4 C)  TempSrc: Oral Oral Oral   Resp: 16 16 16 17   Height:      Weight:      SpO2: 97% 98% 97% 95%    Last BM Date: 07/16/13  Intake/Output   Yesterday:  01/25 0701 - 01/26 0700 In: 792.3 [P.O.:480; I.V.:212.3; IV Piggyback:100] Out: -      Physical Exam: General: Pt awake/alert/oriented x3 in no acute distress Abdomen: Soft.  Nondistended. Non tender. Ext:  SCDs BLE.  No mjr edema.  No cyanosis Skin: No petechiae / purpura Wound: some erythema and induration noted, wound is clean.  Did not tolerate dressing change well.     Problem List:   Active Problems:   Perianal abscess    Results:   Labs: Results for orders placed during the hospital encounter of 07/17/13 (from the past 48 hour(s))  CBC     Status: Abnormal   Collection Time    07/20/13  5:00 AM      Result Value Range   WBC 5.2  4.0 - 10.5 K/uL   RBC 4.35  4.22 - 5.81 MIL/uL   Hemoglobin 12.9 (*) 13.0 - 17.0 g/dL   HCT 40.935.8 (*) 81.139.0 - 91.452.0 %   MCV 82.3  78.0 - 100.0 fL   MCH 29.7  26.0 - 34.0 pg   MCHC 36.0  30.0 - 36.0 g/dL   RDW 78.211.4 (*) 95.611.5 - 21.315.5 %   Platelets 314  150 - 400 K/uL    Imaging / Studies: No results found.  Medications / Allergies: per chart  Antibiotics: Anti-infectives   Start     Dose/Rate Route Frequency Ordered Stop   07/18/13 0600  clindamycin (CLEOCIN) IVPB 600 mg     600 mg 100 mL/hr over 30 Minutes Intravenous 4 times per day 07/18/13 0248     07/18/13 0600  piperacillin-tazobactam (ZOSYN) IVPB 3.375 g     3.375 g 12.5 mL/hr over 240 Minutes Intravenous 3 times per day 07/18/13 0544     07/17/13 2245  clindamycin (CLEOCIN) IVPB 600 mg     600 mg 100 mL/hr over 30 Minutes Intravenous  Once 07/17/13 2244 07/18/13 0014      Assessment/Plan  Right Perianal abscess S/p I&D Dr. Magnus IvanBlackman 07/18/13 -pt has erythema and induration around the incision, continue with IV antibiotics -Pain control -Mobilize  -SCDs, lovenox -SLIV -add miralax  [name], [facility] CityNP-BC [facility] Pager [phone] Office [phone]  07/21/2013 9:27 AM

## 2013-07-21 NOTE — Progress Notes (Signed)
Doping better but not able to tolerate dressing changes with oral pain medications only.  [name] LamasJames O. Gae BonWyatt, III, MD, FACS [phone]--pager [phone]--office [facility]

## 2013-07-22 ENCOUNTER — Encounter (HOSPITAL_COMMUNITY): Payer: Self-pay | Admitting: Surgery

## 2013-07-22 LAB — CBC
HCT: 37.5 % — ABNORMAL LOW (ref 39.0–52.0)
Hemoglobin: 13.6 g/dL (ref 13.0–17.0)
MCH: 30 pg (ref 26.0–34.0)
MCHC: 36.3 g/dL — ABNORMAL HIGH (ref 30.0–36.0)
MCV: 82.6 fL (ref 78.0–100.0)
PLATELETS: 374 10*3/uL (ref 150–400)
RBC: 4.54 MIL/uL (ref 4.22–5.81)
RDW: 11.5 % (ref 11.5–15.5)
WBC: 4.6 10*3/uL (ref 4.0–10.5)

## 2013-07-22 MED ORDER — HYDROMORPHONE HCL PF 1 MG/ML IJ SOLN: 1.0000 mg | INTRAMUSCULAR | Status: DC | PRN

## 2013-07-22 MED ORDER — POLYETHYLENE GLYCOL 3350 17 G PO PACK: 17.0000 g | PACK | Freq: Every day | ORAL | Status: AC

## 2013-07-22 MED ORDER — OXYCODONE-ACETAMINOPHEN 5-325 MG PO TABS: 1.0000 | ORAL_TABLET | ORAL | Status: AC | PRN

## 2013-07-22 MED ORDER — CLINDAMYCIN HCL 300 MG PO CAPS: 300.0000 mg | ORAL_CAPSULE | Freq: Three times a day (TID) | ORAL | Status: AC

## 2013-07-22 MED ORDER — AMOXICILLIN-POT CLAVULANATE 875-125 MG PO TABS: 1.0000 | ORAL_TABLET | Freq: Two times a day (BID) | ORAL | Status: AC

## 2013-07-22 NOTE — Care Management Note (Signed)
CARE MANAGEMENT NOTE 07/22/2013  Patient:  [name] [name]   Account Number:  [id number]  Date Initiated:  07/22/2013  Documentation initiated by:  [facility] Inc[facility]RIEG,MARY  Subjective/Objective Assessment:   admitted with peri rectal abscess     Action/Plan:   Case Manager spoke with patient concerning [facility]. Given Match letter.   Anticipated DC Date:  07/22/2013   Anticipated DC Plan:  HOME/SELF CARE      DC Planning Services  CM consult  MATCH Program      Choice offered to / List presented to:             Status of service:  Completed, signed off Medicare Important Message given?   (If response is "NO", the following Medicare IM given date fields will be blank) Date Medicare IM given:   Date Additional Medicare IM given:    Discharge Disposition:  HOME/SELF CARE

## 2013-07-22 NOTE — Progress Notes (Signed)
Agree with above.  Xaidyn Kepner O. Jahni Nazar, III, MD, FACS [phone]--pager [phone]--office [facility]  

## 2013-07-22 NOTE — Discharge Summary (Signed)
Physician Discharge Summary  [name] [name] ZOX:[id number] DOB: 1985/08/01 DOA: 07/17/2013  PCP: No PCP Per Patient  Consultation: none  Admit date: 07/17/2013 Discharge date: 07/22/2013  Recommendations for Outpatient Follow-up:   Follow-up Information   Follow up with [facility] On 08/05/2013. (arrive by 1:15PM for a 1:45PM appt for a wound check)    Contact information:   [address]   [place] [address] [phone]      Discharge Diagnoses:  1. Perianal abscess   Surgical Procedure: Incision and drainage of perirectal abscess Dr. Magnus IvanBlackman 07/19/13   Discharge Condition: stable Disposition: home     Diet recommendation: regular  Filed Weights   07/20/13 0357  Weight: 230 lb (104.327 kg)     Filed Vitals:   07/22/13 0633  BP: 105/75  Pulse: 65  Temp: 98.7 F (37.1 C)  Resp: 18     Hospital Course:  [name] [name] is a 28 year old male visiting from [place] who [facility].  A CT confirmed an abscess.  He underwent the procedure listed above on the day of admission.  He was started on IV antibiotics for which he was kept on x4 days then discharge with oral atbx.  He had increased post operative pain with dressing changed, slowly transitioned to oral pain medication. Vital signs remained stable, white count normalized. On POD #4 the patient was tolerating a diet, having BMs, pain well controlled and had a clean wound.  He was therefore felt stable for discharge. We reviewed dressing changes with significant other.  He will follow up in 2 weeks for a wound check.  He plans to return to [place] thereafter.  We discussed warning signs that warrant urgent attention.  He was given rx for augmentin and clindamycin for additional 7 days.     Discharge Instructions   Future Appointments Provider Department Dept Phone   08/05/2013 1:45 PM [facility] [place] [phone]        Medication List    STOP taking these medications       HYDROcodone-acetaminophen 5-325 MG per tablet  Commonly known as:  NORCO/VICODIN      TAKE these medications       amoxicillin-clavulanate 875-125 MG per tablet  Commonly known as:  AUGMENTIN  Take 1 tablet by mouth 2 (two) times daily.     clindamycin 300 MG capsule  Commonly known as:  CLEOCIN  Take 1 capsule (300 mg total) by mouth 3 (three) times daily.     ibuprofen 200 MG tablet  Commonly known as:  ADVIL,MOTRIN  Take 800 mg by mouth every 6 (six) hours as needed (pain).     oxyCODONE-acetaminophen 5-325 MG per tablet  Commonly known as:  PERCOCET/ROXICET  Take 1-2 tablets by mouth every 4 (four) hours as needed for moderate pain.     polyethylene glycol packet  Commonly known as:  MIRALAX / GLYCOLAX  Take 17 g by mouth daily.           Follow-up Information   Follow up with [facility] On 08/05/2013. (arrive by 1:15PM for a 1:45PM appt for a wound check)    Contact information:   [address]   [place] [address] [phone]        The results of significant diagnostics from this hospitalization (including imaging, microbiology, ancillary and laboratory) are listed  below for reference.    Significant Diagnostic Studies: Ct Pelvis W Contrast  07/18/2013   CLINICAL DATA:  Diagnosed with cellulitis of right buttock. Evaluate perirectal abscess. Worsening pain.  EXAM: CT PELVIS WITH CONTRAST  TECHNIQUE: Multidetector CT imaging of the pelvis was performed using the standard protocol following the bolus administration of intravenous contrast.  CONTRAST:  80mL OMNIPAQUE IOHEXOL 300 MG/ML  SOLN  COMPARISON:  None.  FINDINGS: Cellulitis about the right sided gluteal cleft. A fluid collection within the anterior portion of the cleft, just inferior to the anal introitus. This measures 5.7 x 2.3 cm on transverse image 139/ series 4 and 5.0 cm craniocaudal on image 61 coronal.  There is likely minimal extension into the left gluteal cleft. No direct communication with the anus identified. The anus and rectum are normal in appearance.  Pelvic small bowel loops are within normal limits. Mild right external iliac adenopathy at 1.5 cm on image 28/series 3. Borderline left external iliac adenopathy at 1.0 cm.  Normal urinary bladder and prostate. No significant free fluid.  No acute osseous abnormality.  IMPRESSION: 1. Perianal abscess centered in the right gluteal cleft, just inferior to the anal introitus. No definite communication identified. Please note that CT is of low sensitivity for fistula, and if this is a clinical concern, the test of choice is high-resolution dedicated contrast-enhanced pelvic MRI. 2. Right external iliac adenopathy, favored to be reactive. If the patient undergoes follow-up imaging, recommend attention to this area.   Electronically Signed   By: Jeronimo Greaves M.D.   On: 07/18/2013 00:55    Microbiology: Recent Results (from the past 240 hour(s))  MRSA PCR SCREENING     Status: None   Collection Time    07/18/13 11:00 AM      Result Value Range Status   MRSA by PCR NEGATIVE  NEGATIVE Final   Comment:            The GeneXpert MRSA Assay (FDA     approved for NASAL specimens     only), is one component of a     comprehensive MRSA colonization     surveillance program. It is not     intended to diagnose MRSA     infection nor to guide or     monitor treatment for     MRSA infections.     Labs: Basic Metabolic Panel:  Recent Labs Lab 07/17/13 1915  NA 134*  K 4.2  CL 96  CO2 23  GLUCOSE 101*  BUN 8  CREATININE 0.83  CALCIUM 9.0   Liver Function Tests: No results found for this basename: AST, ALT, ALKPHOS, BILITOT, PROT, ALBUMIN,  in the last 168 hours No results found for this basename: LIPASE, AMYLASE,  in the last 168 hours No results found for this basename: AMMONIA,  in the last 168 hours CBC:  Recent Labs Lab  07/17/13 1915 07/20/13 0500 07/22/13 0555  WBC 11.6* 5.2 4.6  NEUTROABS 8.3*  --   --   HGB 15.1 12.9* 13.6  HCT 41.3 35.8* 37.5*  MCV 82.8 82.3 82.6  PLT 346 314 374   Active Problems:   Perianal abscess   Signed:  Jujuan Dugo, ANP-BC

## 2013-07-22 NOTE — Discharge Instructions (Signed)

## 2013-07-22 NOTE — Progress Notes (Signed)
Patient ID: [name] [name], male   DOB: May 16, 1986, 28 y.o.   MRN: [id number]   Subjective: Afebrile. VSS. tolerating diet. Normal white count.    Objective:  Vital signs:  Filed Vitals:   07/21/13 0537 07/21/13 1400 07/21/13 2200 07/22/13 0633  BP: 134/80 100/65 102/65 105/75  Pulse: 70 60 62 65  Temp: 97.6 F (36.4 C) 97.6 F (36.4 C) 98.6 F (37 C) 98.7 F (37.1 C)  TempSrc:      Resp: 17 18 18 18   Height:      Weight:      SpO2: 95% 94% 99% 99%    Last BM Date: 07/21/13  Intake/Output   Yesterday:  01/26 0701 - 01/27 0700 In: 500 [IV Piggyback:500] Out: -  This shift:    Physical Exam:  General: Pt awake/alert/oriented x3 in no acute distress  Abdomen: Soft. Nondistended. Non tender.  Ext: SCDs BLE. No mjr edema. No cyanosis  Skin: No petechiae / purpura  Wound: dressing is c/d/i   Problem List:   Active Problems:   Perianal abscess    Results:   Labs: Results for orders placed during the hospital encounter of 07/17/13 (from the past 48 hour(s))  CBC     Status: Abnormal   Collection Time    07/22/13  5:55 AM      Result Value Range   WBC 4.6  4.0 - 10.5 K/uL   RBC 4.54  4.22 - 5.81 MIL/uL   Hemoglobin 13.6  13.0 - 17.0 g/dL   HCT 40.937.5 (*) 81.139.0 - 91.452.0 %   MCV 82.6  78.0 - 100.0 fL   MCH 30.0  26.0 - 34.0 pg   MCHC 36.3 (*) 30.0 - 36.0 g/dL   RDW 78.211.5  95.611.5 - 21.315.5 %   Platelets 374  150 - 400 K/uL    Imaging / Studies: No results found.  Medications / Allergies: per chart  Antibiotics: Anti-infectives   Start     Dose/Rate Route Frequency Ordered Stop   07/18/13 0600  clindamycin (CLEOCIN) IVPB 600 mg     600 mg 100 mL/hr over 30 Minutes Intravenous 4 times per day 07/18/13 0248     07/18/13 0600  piperacillin-tazobactam (ZOSYN) IVPB 3.375 g     3.375 g 12.5 mL/hr over 240 Minutes Intravenous 3 times per day 07/18/13 0544     07/17/13 2245  clindamycin (CLEOCIN) IVPB 600 mg     600 mg 100 mL/hr over 30 Minutes Intravenous  Once  07/17/13 2244 07/18/13 0014     Assessment/Plan  Right Perianal abscess  S/p I&D Dr. Magnus IvanBlackman 07/18/13  -give PO pain meds, I will change dressing at 10AM.  Hopefull discharge home later today if he tolerates dressing changes.  His significant other to change dressing at home. -continue with miralax  Ashok NorrisEmina Kelaiah Escalona, [facility]NP-BC [facility] Pager [phone] Office [phone]  07/22/2013 8:47 AM

## 2013-07-22 NOTE — Progress Notes (Signed)
Patient d/c, IV removed.  Prescriptions given, instructions reviewed.  Wound care reviewed with patient and family.

## 2013-08-05 ENCOUNTER — Encounter (INDEPENDENT_AMBULATORY_CARE_PROVIDER_SITE_OTHER): Payer: Self-pay

## 2013-08-06 ENCOUNTER — Encounter (INDEPENDENT_AMBULATORY_CARE_PROVIDER_SITE_OTHER): Payer: Self-pay

## 2015-01-09 ENCOUNTER — Emergency Department (HOSPITAL_COMMUNITY)
Admission: EM | Admit: 2015-01-09 | Discharge: 2015-01-09 | Disposition: A | Discharge status: Home / Self Care | Payer: Medicaid - Out of State | Attending: Emergency Medicine | Admitting: Emergency Medicine

## 2015-01-09 ENCOUNTER — Encounter (HOSPITAL_COMMUNITY): Payer: Self-pay | Admitting: Emergency Medicine

## 2015-01-09 DIAGNOSIS — K611 Rectal abscess: Secondary | ICD-10-CM | POA: Insufficient documentation

## 2015-01-09 DIAGNOSIS — Z792 Long term (current) use of antibiotics: Secondary | ICD-10-CM | POA: Insufficient documentation

## 2015-01-09 DIAGNOSIS — Z79899 Other long term (current) drug therapy: Secondary | ICD-10-CM | POA: Insufficient documentation

## 2015-01-09 DIAGNOSIS — K649 Unspecified hemorrhoids: Secondary | ICD-10-CM

## 2015-01-09 MED ORDER — IBUPROFEN 400 MG PO TABS
400.0000 mg | ORAL_TABLET | Freq: Once | ORAL | Status: AC
  Administered 2015-01-09: 400 mg via ORAL
  Filled 2015-01-09: qty 1

## 2015-01-09 MED ORDER — HYDROCODONE-ACETAMINOPHEN 5-325 MG PO TABS: 1.0000 | ORAL_TABLET | Freq: Four times a day (QID) | ORAL | Status: AC | PRN

## 2015-01-09 MED ORDER — LIDOCAINE-EPINEPHRINE (PF) 2 %-1:200000 IJ SOLN
20.0000 mL | Freq: Once | INTRAMUSCULAR | Status: AC
  Administered 2015-01-09: 20 mL
  Filled 2015-01-09: qty 20

## 2015-01-09 NOTE — ED Notes (Signed)
R buttock wound covered with dressing, wound care instructions discussed with patient, to follow up with surgery. Pt has no questions at the time of discharge.

## 2015-01-09 NOTE — Discharge Instructions (Signed)
It was our pleasure to provide your ER care today - we hope that you feel better.  Take motrin or aleve as need for pain. You may also take hydrocodone as need for pain. No driving when taking hydrocodone. Also, do not take tylenol or acetaminophen containing medication when taking hydrocodone.  Keep area very clean.  Sitz baths for symptom relief.   Given recurrent peri-rectal problems, follow up with general surgeon in the next 1-2 weeks - see referral - call office Monday for appointment.  Return to ER if worse, new symptoms, fevers, increased swelling, spreading redness, other concern.   Sitz Bath A sitz bath is a warm water bath taken in the sitting position that covers only the hips and buttocks. It may be used for either healing or hygiene purposes. Sitz baths are also used to relieve pain, itching, or muscle spasms. The water may contain medicine. Moist heat will help you heal and relax.  HOME CARE INSTRUCTIONS  Take 3 to 4 sitz baths a day.  Fill the bathtub half full with warm water.  Sit in the water and open the drain a little.  Turn on the warm water to keep the tub half full. Keep the water running constantly.  Soak in the water for 15 to 20 minutes.  After the sitz bath, pat the affected area dry first. SEEK MEDICAL CARE IF:  You get worse instead of better. Stop the sitz baths if you get worse. MAKE SURE YOU:  Understand these instructions.  Will watch your condition.  Will get help right away if you are not doing well or get worse. Document Released: 03/04/2004 Document Revised: 03/06/2012 Document Reviewed: 09/09/2010 [facility] Patient Information 2015 [place], [place]. This information is not intended to replace advice given to you by your health care provider. Make sure you discuss any questions you have with your health care provider.    Peri-Rectal Abscess Your caregiver has diagnosed you as having a peri-rectal abscess. This is an infected area near the  rectum that is filled with pus. If the abscess is near the surface of the skin, your caregiver may open (incise) the area and drain the pus. HOME CARE INSTRUCTIONS   If your abscess was opened up and drained. A small piece of gauze may be placed in the opening so that it can drain. Do not remove the gauze unless directed by your caregiver.  A loose dressing may be placed over the abscess site. Change the dressing as often as necessary to keep it clean and dry.  After the drain is removed, the area may be washed with a gentle antiseptic (soap) four times per day.  A warm sitz bath, warm packs or heating pad may be used for pain relief, taking care not to burn yourself.  Return for a wound check in 1 day or as directed.  An "inflatable doughnut" may be used for sitting with added comfort. These can be purchased at a drugstore or medical supply house.  To reduce pain and straining with bowel movements, eat a high fiber diet with plenty of fruits and vegetables. Use stool softeners as recommended by your caregiver. This is especially important if narcotic type pain medications were prescribed as these may cause marked constipation.  Only take over-the-counter or prescription medicines for pain, discomfort, or fever as directed by your caregiver. SEEK IMMEDIATE MEDICAL CARE IF:   You have increasing pain that is not controlled by medication.  There is increased inflammation (redness), swelling, bleeding,  or drainage from the area.  An oral temperature above 102 F (38.9 C) develops.  You develop chills or generalized malaise (feel lethargic or feel "washed out").  You develop any new symptoms (problems) you feel may be related to your present problem. Document Released: 06/09/2000 Document Revised: 09/04/2011 Document Reviewed: 06/09/2008 [facility] Patient Information 2015 [place], [place]. This information is not intended to replace advice given to you by your health care provider. Make sure  you discuss any questions you have with your health care provider.   Hemorrhoids Hemorrhoids are puffy (swollen) veins around the rectum or anus. Hemorrhoids can cause pain, itching, bleeding, or irritation. HOME CARE  Eat foods with fiber, such as whole grains, beans, nuts, fruits, and vegetables. Ask your doctor about taking products with added fiber in them (fibersupplements).  Drink enough fluid to keep your pee (urine) clear or pale yellow.  Exercise often.  Go to the bathroom when you have the urge to poop. Do not wait.  Avoid straining to poop (bowel movement).  Keep the butt area dry and clean. Use wet toilet paper or moist paper towels.  Medicated creams and medicine inserted into the anus (anal suppository) may be used or applied as told.  Only take medicine as told by your doctor.  Take a warm water bath (sitz bath) for 15-20 minutes to ease pain. Do this 3-4 times a day.  Place ice packs on the area if it is tender or puffy. Use the ice packs between the warm water baths.  Put ice in a plastic bag.  Place a towel between your skin and the bag.  Leave the ice on for 15-20 minutes, 03-04 times a day.  Do not use a donut-shaped pillow or sit on the toilet for a long time. GET HELP RIGHT AWAY IF:   You have more pain that is not controlled by treatment or medicine.  You have bleeding that will not stop.  You have trouble or are unable to poop (bowel movement).  You have pain or puffiness outside the area of the hemorrhoids. MAKE SURE YOU:   Understand these instructions.  Will watch your condition.  Will get help right away if you are not doing well or get worse. Document Released: 03/21/2008 Document Revised: 05/29/2012 Document Reviewed: 04/23/2012 [facility] Patient Information 2015 [place], [place]. This information is not intended to replace advice given to you by your health care provider. Make sure you discuss any questions you have with your health  care provider.

## 2015-01-09 NOTE — ED Notes (Signed)
Since Wednesday pt has noticed a "cyst" near his rectum, today he was going to go to work and it was painful so he came here instead.  Afebrile.
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
## 2015-07-11 IMAGING — CT CT PELVIS W/ CM
2 of 4 series · 16 of 46 positions shown, 18 images · IV contrast (omnipaque)
Comparison: None.

CLINICAL DATA: Diagnosed with cellulitis of right buttock. Evaluate
perirectal abscess. Worsening pain.

EXAM:
CT PELVIS WITH CONTRAST
TECHNIQUE: Multidetector CT imaging of the pelvis was performed using the
standard protocol following the bolus administration of intravenous
contrast.
CONTRAST:  80mL OMNIPAQUE IOHEXOL 300 MG/ML  SOLN

[Series 4: pelvis 2.0 b31f · axial · 0.89mm/px · z∈[-568,-276]mm · 13 of 162 slices shown, 15 images]
[im 8/162  soft-tissue]
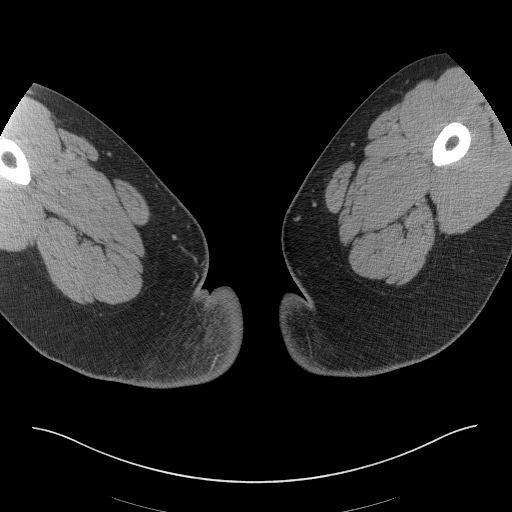
[im 8/162  bone]
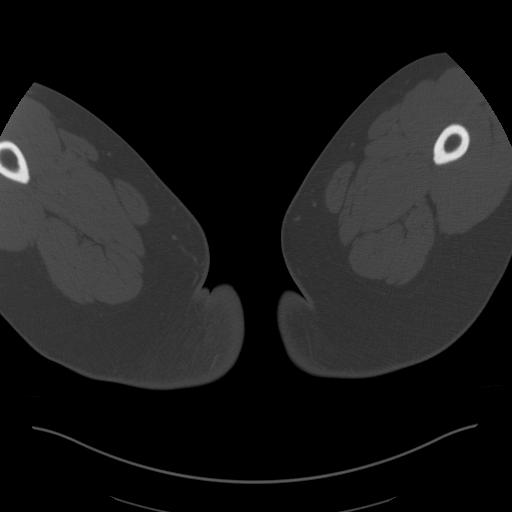
[im 22/162  soft-tissue]
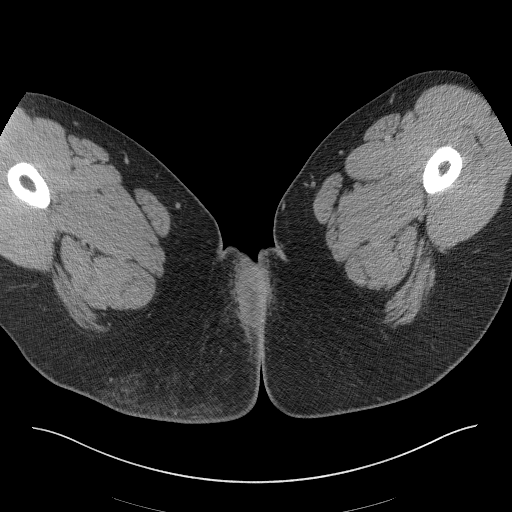
[im 37/162  soft-tissue]
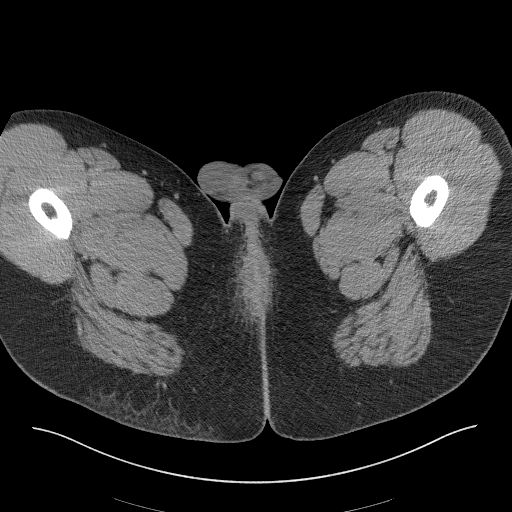
[im 44/162  soft-tissue]
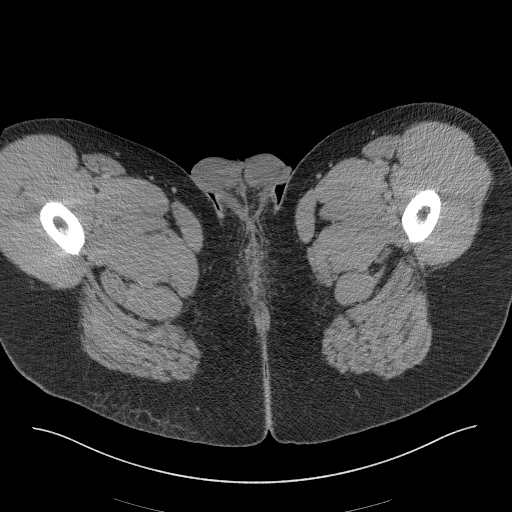
[im 59/162  soft-tissue]
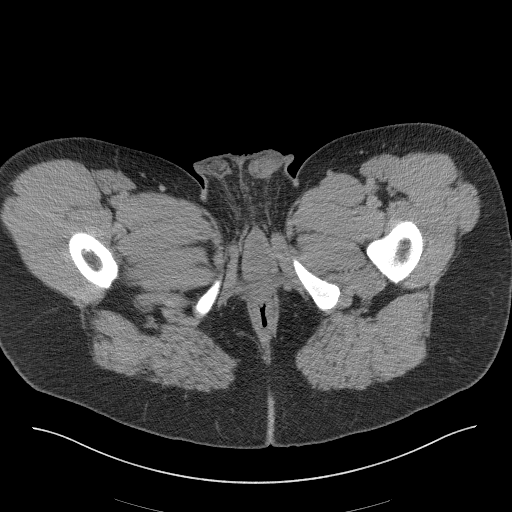
[im 66/162  soft-tissue]
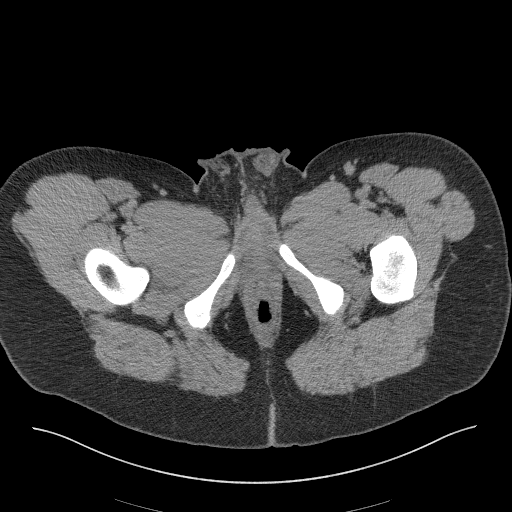
[im 81/162  soft-tissue]
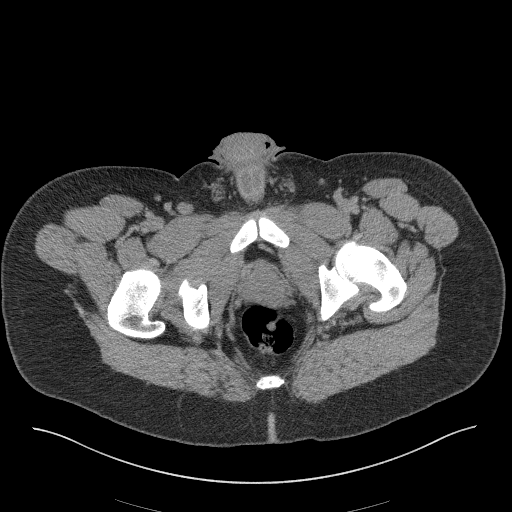
[im 96/162  soft-tissue]
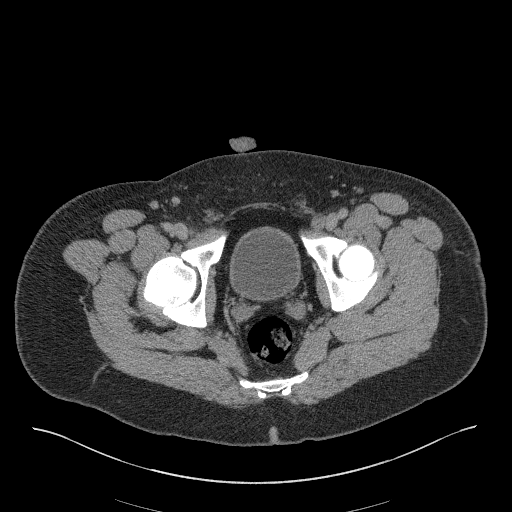
[im 103/162  soft-tissue]
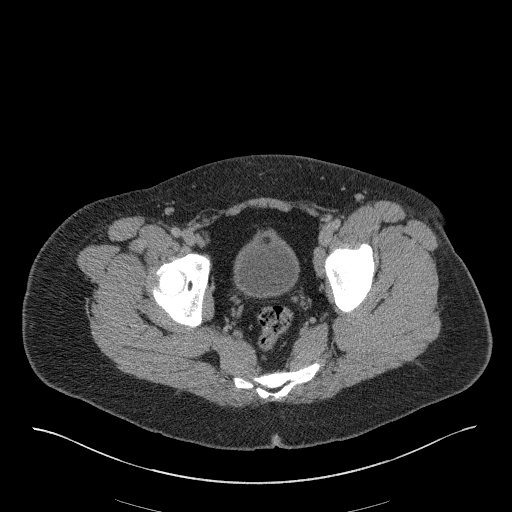
[im 103/162  bone]
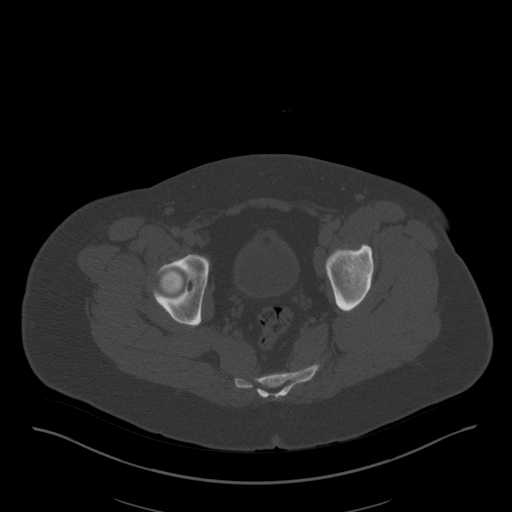
[im 118/162  soft-tissue]
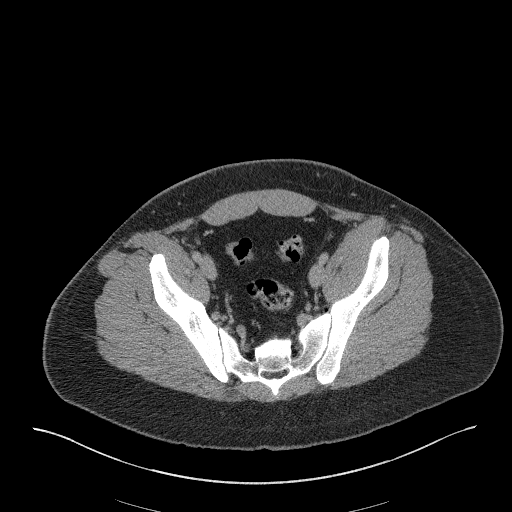
[im 125/162  soft-tissue]
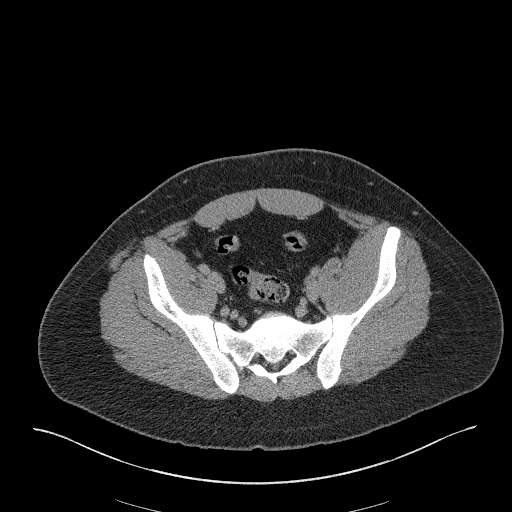
[im 140/162  soft-tissue]
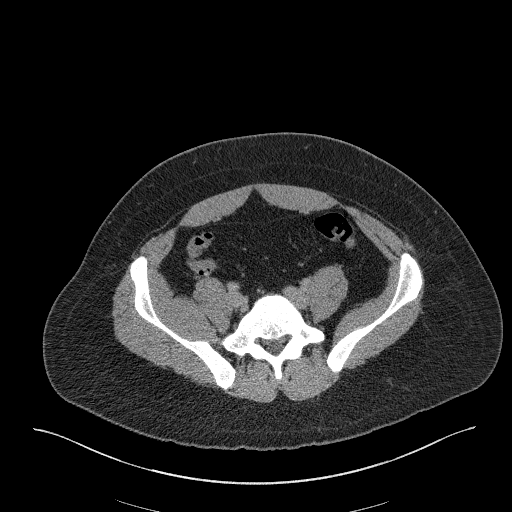
[im 154/162  soft-tissue]
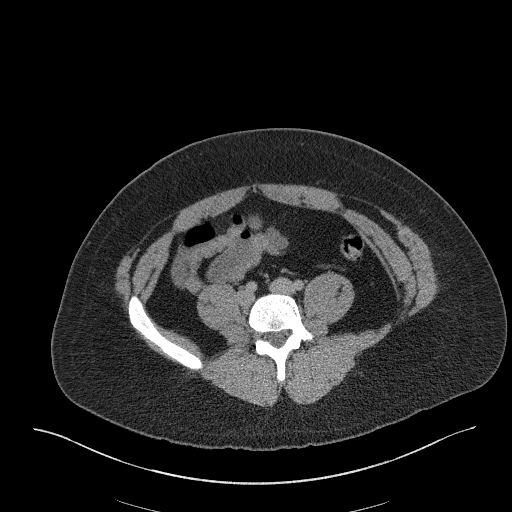

[Series 5: pelvis 2.0 mpr cor · coronal · 0.63mm/px · 3 of 138 slices shown]
[im 46/138  soft-tissue]
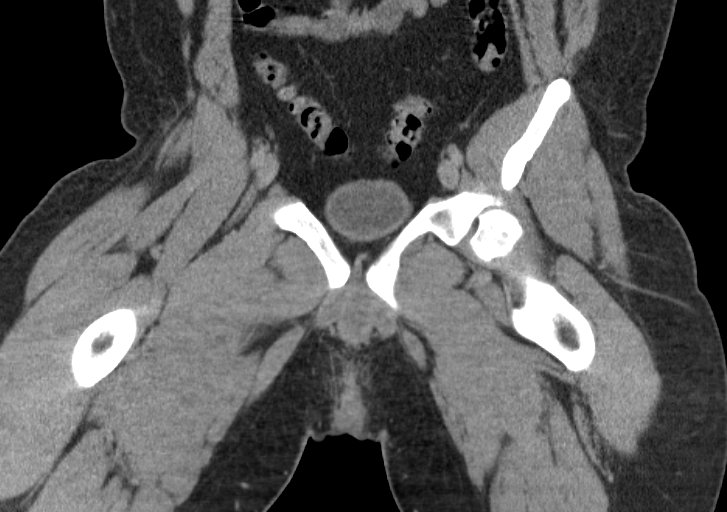
[im 61/138  soft-tissue]
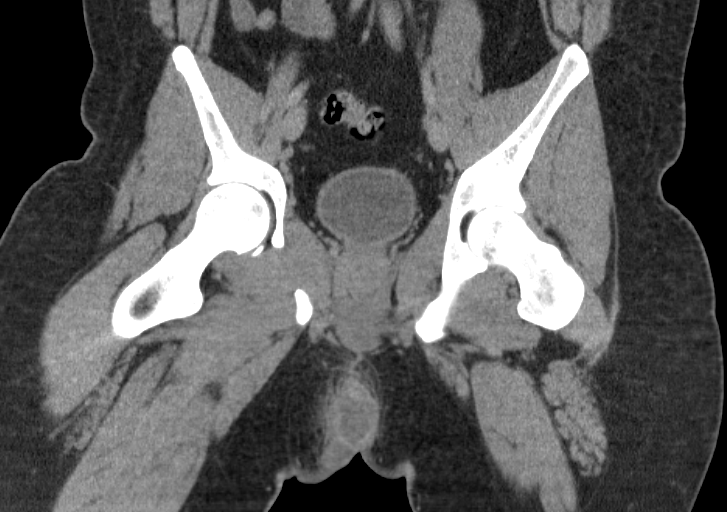
[im 77/138  soft-tissue]
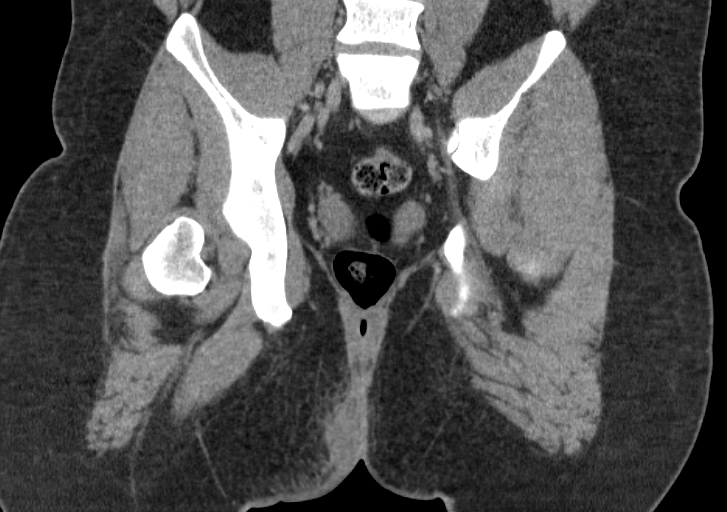

[16 of 46 positions shown; findings below may reference images not displayed]

FINDINGS: Cellulitis about the right sided gluteal cleft. A fluid collection
within the anterior portion of the cleft, just inferior to the anal
introitus. This measures 5.7 x 2.3 cm on transverse image 139/
series 4 and 5.0 cm craniocaudal on image 61 coronal. There is
likely minimal extension into the left gluteal cleft. No direct
communication with the anus identified. The anus and rectum are
normal in appearance.

Pelvic small bowel loops are within normal limits. Mild right
external iliac adenopathy at 1.5 cm on image 28/series 3. Borderline
left external iliac adenopathy at 1.0 cm.

Normal urinary bladder and prostate. No significant free fluid.

No acute osseous abnormality.
IMPRESSION: 1. Perianal abscess centered in the right gluteal cleft, just
inferior to the anal introitus. No definite communication
identified. Please note that CT is of low sensitivity for fistula,
and if this is a clinical concern, the test of choice is
high-resolution dedicated contrast-enhanced pelvic MRI.
2. Right external iliac adenopathy, favored to be reactive. If the
patient undergoes follow-up imaging, recommend attention to this
area.

## 2016-01-02 ENCOUNTER — Encounter (HOSPITAL_COMMUNITY): Payer: Self-pay | Admitting: *Deleted

## 2016-01-02 ENCOUNTER — Emergency Department (HOSPITAL_COMMUNITY)
Admission: EM | Admit: 2016-01-02 | Discharge: 2016-01-02 | Disposition: A | Discharge status: Home / Self Care | Payer: MEDICAID | Attending: Emergency Medicine | Admitting: Emergency Medicine

## 2016-01-02 DIAGNOSIS — Z79899 Other long term (current) drug therapy: Secondary | ICD-10-CM | POA: Insufficient documentation

## 2016-01-02 DIAGNOSIS — H6122 Impacted cerumen, left ear: Secondary | ICD-10-CM | POA: Insufficient documentation

## 2016-01-02 MED ORDER — CARBAMIDE PEROXIDE 6.5 % OT SOLN: 5.0000 [drp] | Freq: Two times a day (BID) | OTIC | Status: AC

## 2016-01-02 NOTE — Discharge Instructions (Signed)
Use ear drops as directed to help with your earwax impaction. DO NOT USE QTIPS OR ANY OTHER MEDICATIONS/ITEMS IN YOUR EAR. Follow up with [facility] in 1 week for recheck and ongoing management of your earwax build up, and to establish care. If you can't get an appointment with them, either use their walk-in hours between 9am and 5pm or go to urgent care for recheck of symptoms. Return to the ER for changes or worsening symptoms.   Cerumen Impaction The structures of the external ear canal secrete a waxy substance known as cerumen. Excess cerumen can build up in the ear canal, causing a condition known as cerumen impaction. Cerumen impaction can cause ear pain and disrupt the function of the ear. The rate of cerumen production differs for each individual. In certain individuals, the configuration of the ear canal may decrease his or her ability to naturally remove cerumen. CAUSES Cerumen impaction is caused by excessive cerumen production or buildup. RISK FACTORS  Frequent use of swabs to clean ears.  Having narrow ear canals.  Having eczema.  Being dehydrated. SIGNS AND SYMPTOMS  Diminished hearing.  Ear drainage.  Ear pain.  Ear itch. TREATMENT Treatment may involve:  Over-the-counter or prescription ear drops to soften the cerumen.  Removal of cerumen by a health care provider. This may be done with:  Irrigation with warm water. This is the most common method of removal.  Ear curettes and other instruments.  Surgery. This may be done in severe cases. HOME CARE INSTRUCTIONS  Take medicines only as directed by your health care provider.  Do not insert objects into the ear with the intent of cleaning the ear. PREVENTION  Do not insert objects into the ear, even with the intent of cleaning the ear. Removing cerumen as a part of normal hygiene is not necessary, and the use of swabs in the ear canal is not recommended.  Drink enough water to keep your urine  clear or pale yellow.  Control your eczema if you have it. SEEK MEDICAL CARE IF:  You develop ear pain.  You develop bleeding from the ear.  The cerumen does not clear after you use ear drops as directed.   This information is not intended to replace advice given to you by your health care provider. Make sure you discuss any questions you have with your health care provider.   Document Released: 07/20/2004 Document Revised: 07/03/2014 Document Reviewed: 01/27/2015 Elsevier Interactive Patient Education 2016 Elsevier Inc.  Ear Drops, Adult You need to put eardrops in your ear. HOME CARE   Put drops in your affected ear as told.  After putting in the drops, lie down with the ear you put the drops in facing up. Stay this way for 10 minutes. Use the ear drops as long as your doctor tells you.  Before you get up, put a cotton ball gently in your ear. Do not push it far in your ear.  Do not wash out your ears unless your doctor says it is okay.  Finish all medicines as told by your doctor. You may be told to keep using the eardrops even if you start to feel better.  See your doctor as told for follow-up visits. GET HELP IF:  You have pain that gets worse.  Any unusual fluid (drainage) is coming from your ear (especially if the fluid stinks).  You have trouble hearing.  You get really dizzy as if the room is spinning and feel sick to  your stomach (vertigo).  The outside of your ear becomes red or puffy or both. This may be a sign of an allergic reaction. MAKE SURE YOU:   Understand these instructions.  Will watch your condition.  Will get help right away if you are not doing well or get worse.   This information is not intended to replace advice given to you by your health care provider. Make sure you discuss any questions you have with your health care provider.   Document Released: 11/30/2009 Document Revised: 07/03/2014 Document Reviewed: 01/07/2013 Elsevier  Interactive Patient Education [employer]

## 2016-01-02 NOTE — ED Notes (Signed)
Pt reports water and wax in Lt ear.
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
## 2016-01-02 NOTE — ED Notes (Signed)
Declined W/C at D/C and was escorted to lobby by RN. 

## 2019-01-12 ENCOUNTER — Other Ambulatory Visit: Payer: Self-pay

## 2019-01-12 ENCOUNTER — Emergency Department (HOSPITAL_COMMUNITY)
Admission: EM | Admit: 2019-01-12 | Discharge: 2019-01-12 | Disposition: A | Discharge status: Home / Self Care | Payer: Medicaid - Out of State | Attending: Emergency Medicine | Admitting: Emergency Medicine

## 2019-01-12 ENCOUNTER — Encounter (HOSPITAL_COMMUNITY): Payer: Self-pay | Admitting: Emergency Medicine

## 2019-01-12 ENCOUNTER — Telehealth: Payer: Self-pay | Admitting: *Deleted

## 2019-01-12 DIAGNOSIS — H60331 Swimmer's ear, right ear: Secondary | ICD-10-CM

## 2019-01-12 MED ORDER — CIPRODEX 0.3-0.1 % OT SUSP: 4.0000 [drp] | Freq: Two times a day (BID) | OTIC | 0 refills | Status: DC

## 2019-01-12 NOTE — ED Triage Notes (Signed)
Pt c/o right ear pain x 2 days after going to the beach. Reports he has been putting alcohol drops in his ear.

## 2019-01-12 NOTE — Discharge Instructions (Signed)
Apply 4 drops into your right ear every 12 hours for the next 7 days.  You may need to lay on your left side after you put the drops in for a few minutes to avoid them draining out immediately. He did not have any improvement after few days, it is recommended you seek follow-up visits.  You can report to the [facility] urgent care for follow-up as needed.
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
## 2019-01-12 NOTE — ED Notes (Signed)
C/o right ear pain after being at the beach last week-- "feels like there is some water in ear"

## 2019-01-12 NOTE — Telephone Encounter (Signed)
TOC CM received call from pt and states he was able to get the Rx that was called in by Dr Quentin Cornwall for different ear drop with [place]. Jonnie Finner RN CCM Case Mgmt phone [phone]

## 2019-01-12 NOTE — Telephone Encounter (Signed)
TOC CM contacted pt and states the Ciprodex $270 and he cannot afford medication. States he did recently sign up for insurance but it will not be in effect until 02/09/2019. Explained he will need to arrange a follow up appt with a PCP as soon as he receives his card. Faxed Match letter to [employer] in [place]. Rx called into pharmacy and will be ready tomorrow for $3. Explained to pt that he can only use MATCH once per year for the $3 copay. States he is in a lot of pain and is coming back to ED. Jonnie Finner RN CCM Case Mgmt phone [phone]

## 2019-01-12 NOTE — Telephone Encounter (Signed)
                  Dear   : [name] [name]  You have been approved to have the prescriptions written by your discharging physician filled through our [facility] (Medication Assistance Through [facility]) program. This program allows for a one-time (no refills) 34-day supply of selected medications for a low copay amount.  The copay is $3.00 per prescription. For instance, if you have one prescription, you will pay $3.00; for two prescriptions, you pay $6.00; for three prescriptions, you pay $9.00; and so on.  Only certain pharmacies are participating in this program with [facility]. You will need to select one of the pharmacies from the attached list and take your prescriptions, this letter, and your photo ID to one of the participating pharmacies.   We are excited that you are able to use the [facility] program to get your medications. These prescriptions must be filled within 7 days of hospital discharge or they will no longer be valid for the [facility] program. Should you have any problems with your prescriptions please contact your case management team member at [phone] or [phone] for [place]/ [place] you, [facility]

## 2021-10-13 ENCOUNTER — Encounter (HOSPITAL_COMMUNITY): Payer: Self-pay | Admitting: Emergency Medicine

## 2021-10-13 ENCOUNTER — Emergency Department (HOSPITAL_COMMUNITY)
Admission: EM | Admit: 2021-10-13 | Discharge: 2021-10-13 | Disposition: A | Discharge status: Home / Self Care | Payer: Self-pay | Attending: Emergency Medicine | Admitting: Emergency Medicine

## 2021-10-13 ENCOUNTER — Other Ambulatory Visit: Payer: Self-pay

## 2021-10-13 DIAGNOSIS — R030 Elevated blood-pressure reading, without diagnosis of hypertension: Secondary | ICD-10-CM | POA: Insufficient documentation

## 2021-10-13 DIAGNOSIS — H1033 Unspecified acute conjunctivitis, bilateral: Secondary | ICD-10-CM | POA: Insufficient documentation

## 2021-10-13 DIAGNOSIS — R7401 Elevation of levels of liver transaminase levels: Secondary | ICD-10-CM | POA: Insufficient documentation

## 2021-10-13 LAB — CBC WITH DIFFERENTIAL/PLATELET
Abs Immature Granulocytes: 0.01 10*3/uL (ref 0.00–0.07)
Basophils Absolute: 0.1 10*3/uL (ref 0.0–0.1)
Basophils Relative: 1 %
Eosinophils Absolute: 0.4 10*3/uL (ref 0.0–0.5)
Eosinophils Relative: 7 %
HCT: 47.4 % (ref 39.0–52.0)
Hemoglobin: 16 g/dL (ref 13.0–17.0)
Immature Granulocytes: 0 %
Lymphocytes Relative: 29 %
Lymphs Abs: 1.7 10*3/uL (ref 0.7–4.0)
MCH: 28.7 pg (ref 26.0–34.0)
MCHC: 33.8 g/dL (ref 30.0–36.0)
MCV: 85.1 fL (ref 80.0–100.0)
Monocytes Absolute: 0.7 10*3/uL (ref 0.1–1.0)
Monocytes Relative: 11 %
Neutro Abs: 3.1 10*3/uL (ref 1.7–7.7)
Neutrophils Relative %: 52 %
Platelets: 295 10*3/uL (ref 150–400)
RBC: 5.57 MIL/uL (ref 4.22–5.81)
RDW: 11.8 % (ref 11.5–15.5)
WBC: 6 10*3/uL (ref 4.0–10.5)
nRBC: 0 % (ref 0.0–0.2)

## 2021-10-13 LAB — COMPREHENSIVE METABOLIC PANEL
ALT: 118 U/L — ABNORMAL HIGH (ref 0–44)
AST: 55 U/L — ABNORMAL HIGH (ref 15–41)
Albumin: 4 g/dL (ref 3.5–5.0)
Alkaline Phosphatase: 53 U/L (ref 38–126)
Anion gap: 7 (ref 5–15)
BUN: 12 mg/dL (ref 6–20)
CO2: 24 mmol/L (ref 22–32)
Calcium: 9.3 mg/dL (ref 8.9–10.3)
Chloride: 108 mmol/L (ref 98–111)
Creatinine, Ser: 0.85 mg/dL (ref 0.61–1.24)
GFR, Estimated: >60 mL/min (ref >60)
Glucose, Bld: 106 mg/dL — ABNORMAL HIGH (ref 70–99)
Potassium: 3.7 mmol/L (ref 3.5–5.1)
Sodium: 139 mmol/L (ref 135–145)
Total Bilirubin: 0.8 mg/dL (ref 0.3–1.2)
Total Protein: 7.4 g/dL (ref 6.5–8.1)

## 2021-10-13 LAB — LIPASE, BLOOD: Lipase: 34 U/L (ref 11–51)

## 2021-10-13 LAB — URINALYSIS, ROUTINE W REFLEX MICROSCOPIC
Bilirubin Urine: NEGATIVE
Glucose, UA: NEGATIVE mg/dL
Hgb urine dipstick: NEGATIVE
Ketones, ur: NEGATIVE mg/dL
Leukocytes,Ua: NEGATIVE
Nitrite: NEGATIVE
Protein, ur: NEGATIVE mg/dL
Specific Gravity, Urine: 1.03 (ref 1.005–1.030)
pH: 5 (ref 5.0–8.0)

## 2021-10-13 MED ORDER — TETRACAINE HCL 0.5 % OP SOLN
1.0000 [drp] | Freq: Once | OPHTHALMIC | Status: AC
  Administered 2021-10-13: 1 [drp] via OPHTHALMIC
  Filled 2021-10-13: qty 4

## 2021-10-13 MED ORDER — FLUORESCEIN SODIUM 1 MG OP STRP
1.0000 | ORAL_STRIP | Freq: Once | OPHTHALMIC | Status: AC
  Administered 2021-10-13: 1 via OPHTHALMIC
  Filled 2021-10-13: qty 1

## 2021-10-13 MED ORDER — POLYMYXIN B-TRIMETHOPRIM 10000-0.1 UNIT/ML-% OP SOLN: 1.0000 [drp] | OPHTHALMIC | 0 refills | Status: AC

## 2021-10-13 NOTE — ED Provider Triage Note (Signed)
Emergency Medicine Provider Triage Evaluation Note ? ?[name] [name] , a 36 y.o. male  was evaluated in triage.  Pt complains of bilateral eye irritation.  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  Approximately 2 days ago his eyes began watering and he has been waking up with crust in his eyelashes.  He states that he bough an eye solution and flushed his eyes.  Denies eye itchiness, pain, vision changes.  Endorses watery eyes, discharge.  After talking about eye problems patient also states that he has left-sided abdominal pain, and is unable to belch that he feels that he needs to. ? ?Review of Systems  ?Positive: Abdominal pain, eye discharge ?Negative: Chest pain, shortness of breath ? ?Physical Exam  ?BP (!) 159/112 (BP Location: Right Arm)   Pulse 84   Temp 98.5 ?F (36.9 ?C) (Oral)   Resp 18   SpO2 97%  ?Gen:   Awake, no distress   ?Resp:  Normal effort  ?MSK:   Moves extremities without difficulty  ?Other:   ? ?Medical Decision Making  ?Medically screening exam initiated at 2:59 PM.  Appropriate orders placed.  [name] [name] was informed that the remainder of the evaluation will be completed by another provider, this initial triage assessment does not replace that evaluation, and the importance of remaining in the ED until their evaluation is complete. ? ? ?  ?Darrick Grinder, PA-C ?10/13/21 1501 ? ?

## 2021-10-13 NOTE — ED Triage Notes (Signed)
Patient complains of left sided abdominal pain that started a few days ago. Patient is alert, oriented, and in no apparent distress at this time. Patient also complains of eye irritation in bilateral eyes.  ?

## 2021-10-13 NOTE — Discharge Instructions (Signed)
You have been evaluated in the Emergency Department today for bilateral red eye.  Your symptoms most likely secondary to bacterial conjunctivitis.  I have given you an antibiotic drop to take.  In addition I have given you a phone number for ophthalmology.  Please give them a call for follow-up for reevaluation.  Please follow up with your primary care physician within two days. ?Return to the Emergency Department if you experience if you experience worsening symptoms including blurry vision.  Or if you experience severe pain or start having fever. ?Thank you for choosing [place] for your care. ? ?

## 2021-10-13 NOTE — ED Notes (Signed)
Patient verbalizes understanding of d/c instructions. Opportunities for questions and answers were provided. Pt d/c from ED and ambulated to lobby.  

## 2021-10-13 NOTE — ED Provider Notes (Signed)
?[facility] EMERGENCY DEPARTMENT ?Provider Note ? ? ?CSN: [id number] ?Arrival date & time: 10/13/21  1438 ? ?  ? ?History ? ?Chief Complaint  ?Patient presents with  ? Abdominal Pain  ? ? ?Ulis Kaps is a 36 y.o. male. ? ? ?Abdominal Pain ? ?Patient is a 36 year old male otherwise healthy presents to the emergency department for bilateral eye redness for the past 3 days.  Patient reported has been working on house and has been dealing with plaster removal and has been exposed to a lot of dust particles.  He report foreign body sensation.  He noticed his eye got red and has been using over-the-counter water solution.  Denies wearing contact lenses.  Denies associated visual changes or pain.  Patient continued to have worsening redness which brought him to the emergency department.  He also reported he does have seasonal allergies but does not get redeye from his allergies.  He has tried over-the-counter water solution which has improved his symptoms.  He also reports that 2 months of left-sided abdominal pain.  He described it as " whenever I sneeze feel meat stuck around my flank".  Denies associated chest pain or shortness of breath.  Denies other abdominal pain.  Denies hematuria, hematemesis, hematochezia.  Denies being constipated and reports regular bowel movement.  Denies any recent outside of the country travel.  Denies any injury to the abdomen.  He reports he is most worried about his eyes.  Otherwise no other complaints. ? ?Home Medications ?Prior to Admission medications   ?Medication Sig Start Date End Date Taking? Authorizing Provider  ?acetaminophen (TYLENOL) 500 MG tablet Take 500 mg by mouth every 6 (six) hours as needed for moderate pain.   Yes [provider]  ?trimethoprim-polymyxin b (POLYTRIM) ophthalmic solution Place 1 drop into both eyes every 4 (four) hours. 10/13/21  Yes Jari Sportsman, MD  ?amoxicillin-clavulanate (AUGMENTIN) 875-125 MG per tablet Take 1  tablet by mouth 2 (two) times daily. ?Patient not taking: Reported on 10/13/2021 07/22/13   Ashok Norris, NP  ?carbamide peroxide (DEBROX) 6.5 % otic solution Place 5 drops into the left ear 2 (two) times daily. X 5 days ?Patient not taking: Reported on 10/13/2021 01/02/16   Street, [place], PA-C  ?clindamycin (CLEOCIN) 300 MG capsule Take 1 capsule (300 mg total) by mouth 3 (three) times daily. ?Patient not taking: Reported on 10/13/2021 07/22/13   Ashok Norris, NP  ?HYDROcodone-acetaminophen (NORCO/VICODIN) 5-325 MG per tablet Take 1-2 tablets by mouth every 6 (six) hours as needed for moderate pain. ?Patient not taking: Reported on 10/13/2021 01/09/15   Cathren Laine, MD  ?oxyCODONE-acetaminophen (PERCOCET/ROXICET) 5-325 MG per tablet Take 1-2 tablets by mouth every 4 (four) hours as needed for moderate pain. ?Patient not taking: Reported on 10/13/2021 07/22/13   Ashok Norris, NP  ?polyethylene glycol (MIRALAX / GLYCOLAX) packet Take 17 g by mouth daily. ?Patient not taking: Reported on 10/13/2021 07/22/13   Ashok Norris, NP  ?   ? ?Allergies    ?Patient has no known allergies.   ? ?Review of Systems   ?Review of Systems  ?Gastrointestinal:  Positive for abdominal pain.  ? ?Physical Exam ?Updated Vital Signs ?BP (!) 150/86   Pulse 80   Temp 98.4 ?F (36.9 ?C) (Oral)   Resp 18   Ht 5\' 9"  (1.753 m)   Wt 104.3 kg   SpO2 98%   BMI 33.96 kg/m?  ?Physical Exam ?Constitutional:   ?   General: He is not in  acute distress. ?   Appearance: He is not ill-appearing.  ?HENT:  ?   Head: Normocephalic and atraumatic.  ?Eyes:  ?   Extraocular Movements: Extraocular movements intact.  ?   Pupils: Pupils are equal, round, and reactive to light.  ?   Comments: Bilateral conjunctival injection.  There is yellow crustacean around the eyelashes.  Extraocular movements are intact.  No sign of entrapment.  No obvious trauma or eyelid swelling.  ?Cardiovascular:  ?   Rate and Rhythm: Normal rate.  ?Pulmonary:  ?   Effort: Pulmonary  effort is normal.  ?   Breath sounds: Normal breath sounds.  ?Abdominal:  ?   General: Abdomen is flat. There is no distension or abdominal bruit. There are no signs of injury.  ?   Palpations: Abdomen is soft.  ?   Tenderness: There is no abdominal tenderness. There is no right CVA tenderness, left CVA tenderness, guarding or rebound.  ?   Hernia: No hernia is present.  ?Skin: ?   General: Skin is warm.  ?   Capillary Refill: Capillary refill takes less than 2 seconds.  ?Neurological:  ?   General: No focal deficit present.  ?   Mental Status: He is alert.  ?Psychiatric:     ?   Mood and Affect: Mood normal.  ? ? ?ED Results / Procedures / Treatments   ?Labs ?(all labs ordered are listed, but only abnormal results are displayed) ?Labs Reviewed  ?COMPREHENSIVE METABOLIC PANEL - Abnormal; Notable for the following components:  ?    Result Value  ? Glucose, Bld 106 (*)   ? AST 55 (*)   ? ALT 118 (*)   ? All other components within normal limits  ?URINALYSIS, ROUTINE W REFLEX MICROSCOPIC - Abnormal; Notable for the following components:  ? APPearance HAZY (*)   ? All other components within normal limits  ?CBC WITH DIFFERENTIAL/PLATELET  ?LIPASE, BLOOD  ? ? ?EKG ?None ? ?Radiology ?No results found. ? ?Procedures ?Procedures  ? ?Medications Ordered in ED ?Medications  ?tetracaine (PONTOCAINE) 0.5 % ophthalmic solution 1 drop (1 drop Both Eyes Given 10/13/21 1836)  ?fluorescein ophthalmic strip 1 strip (1 strip Both Eyes Given 10/13/21 1836)  ? ? ?ED Course/ Medical Decision Making/ A&P ?  ?                        ?Medical Decision Making ?Problems Addressed: ?Acute bacterial conjunctivitis of both eyes: acute illness or injury that poses a threat to life or bodily functions ? ?Amount and/or Complexity of Data Reviewed ?Labs: ordered. Decision-making details documented in ED Course. ? ?Risk ?Prescription drug management. ? ? ?Patient is a 36 year old male who presents to the emergency department for bilateral conjunctival  injection.  His vital signs remarkable for mildly elevated blood pressure.  Patient exam as above. ? ?Patient presentation is concerning for corneal abrasion.  Other differential include viral versus bacterial conjunctivitis.  Patient abdominal exam is benign.  Less concern for acute etiology at this time. ? ?For his abdominal pain, patient did get basic labs including urinalysis and lipase at first look.  His CBC without leukocytosis and hemoglobin hematocrit are stable.  His CMP without severe metabolic derangement.  He does have very mild elevation of his AST and ALT.  His lipase is unremarkable.  Urinalysis without urine RBC or leukocytes or nitrates.  Less concern for cystitis or nephrolithiasis at this time. ? ?Fluorescein exam did not show corneal  abrasion.  In addition no Seidel sign. ? ?Patient bilateral conjunctival injection is most likely secondary to bacterial conjunctivitis.  Will treat with Polytrim and and I have provided follow-up with ophthalmology.  Strict return precaution has been discussed.  Recommend follow-up with PCP in 2 to 3 days.  Patient understand and agrees with plan. ? ? ?Final Clinical Impression(s) / ED Diagnoses ?Final diagnoses:  ?Acute bacterial conjunctivitis of both eyes  ? ? ?Rx / DC Orders ?ED Discharge Orders   ? ?      Ordered  ?  trimethoprim-polymyxin b (POLYTRIM) ophthalmic solution  Every 4 hours       ? 10/13/21 1914  ? ?  ?  ? ?  ? ? ?  ?Jari SportsmanWodajo, Shivank Pinedo T, MD ?10/13/21 1935 ? ?  ?Rozelle LoganHorton, Kristie M, DO ?10/14/21 2351 ? ?

## 2021-10-13 NOTE — ED Notes (Signed)
Pt reports he came to the ED today for bilateral eye redness w/ crusting and L lower abd pain when he tries to burp that has been going on for months. ?
